# Patient Record
Sex: Female | Born: 1990 | Race: White | Hispanic: No | Marital: Single | Smoking: Never smoker
Health system: Southern US, Community
[De-identification: ages and names within clinical notes are randomized; demographics above are authoritative.]

## PROBLEM LIST (undated history)

## (undated) ENCOUNTER — Inpatient Hospital Stay (HOSPITAL_COMMUNITY): Payer: Self-pay

## (undated) DIAGNOSIS — Z3046 Encounter for surveillance of implantable subdermal contraceptive: Principal | ICD-10-CM

## (undated) DIAGNOSIS — Z903 Acquired absence of stomach [part of]: Secondary | ICD-10-CM

## (undated) DIAGNOSIS — R112 Nausea with vomiting, unspecified: Secondary | ICD-10-CM

## (undated) DIAGNOSIS — R51 Headache: Secondary | ICD-10-CM

## (undated) DIAGNOSIS — R87629 Unspecified abnormal cytological findings in specimens from vagina: Secondary | ICD-10-CM

## (undated) DIAGNOSIS — Z975 Presence of (intrauterine) contraceptive device: Secondary | ICD-10-CM

## (undated) HISTORY — DX: Unspecified abnormal cytological findings in specimens from vagina: R87.629

## (undated) HISTORY — DX: Nausea with vomiting, unspecified: R11.2

## (undated) HISTORY — PX: CYSTECTOMY: SUR359

## (undated) HISTORY — DX: Presence of (intrauterine) contraceptive device: Z97.5

## (undated) HISTORY — DX: Encounter for surveillance of implantable subdermal contraceptive: Z30.46

## (undated) HISTORY — PX: ABDOMINAL SURGERY: SHX537

## (undated) HISTORY — PX: STOMACH SURGERY: SHX791

---
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
## 2009-03-10 ENCOUNTER — Emergency Department (HOSPITAL_COMMUNITY): Admission: EM | Admit: 2009-03-10 | Discharge: 2009-03-10 | Payer: Self-pay | Admitting: Emergency Medicine

## 2010-01-21 ENCOUNTER — Emergency Department (HOSPITAL_COMMUNITY)
Admission: EM | Admit: 2010-01-21 | Discharge: 2010-01-21 | Payer: Self-pay | Source: Home / Self Care | Admitting: Emergency Medicine

## 2010-01-22 ENCOUNTER — Emergency Department (HOSPITAL_COMMUNITY)
Admission: EM | Admit: 2010-01-22 | Discharge: 2010-01-22 | Payer: Self-pay | Source: Home / Self Care | Admitting: Emergency Medicine

## 2010-04-11 LAB — STREP A DNA PROBE

## 2010-04-11 LAB — RAPID STREP SCREEN (MED CTR MEBANE ONLY): Streptococcus, Group A Screen (Direct): NEGATIVE

## 2010-04-20 LAB — CBC
HCT: 43.3 % (ref 36.0–46.0)
WBC: 8.4 10*3/uL (ref 4.0–10.5)

## 2010-04-20 LAB — BASIC METABOLIC PANEL
Chloride: 101 mEq/L (ref 96–112)
Creatinine, Ser: 0.82 mg/dL (ref 0.4–1.2)
GFR calc non Af Amer: >60 mL/min (ref >60)

## 2010-04-20 LAB — DIFFERENTIAL
Basophils Relative: 0 % (ref 0–1)
Monocytes Absolute: 0.3 10*3/uL (ref 0.1–1.0)
Monocytes Relative: 3 % (ref 3–12)
Neutro Abs: 6.3 10*3/uL (ref 1.7–7.7)
Neutrophils Relative %: 75 % (ref 43–77)

## 2011-02-07 ENCOUNTER — Emergency Department (HOSPITAL_COMMUNITY)
Admission: EM | Admit: 2011-02-07 | Discharge: 2011-02-08 | Disposition: A | Discharge status: Home / Self Care | Payer: Self-pay | Attending: Emergency Medicine | Admitting: Emergency Medicine

## 2011-02-07 ENCOUNTER — Encounter: Payer: Self-pay | Admitting: *Deleted

## 2011-02-07 DIAGNOSIS — R3915 Urgency of urination: Secondary | ICD-10-CM | POA: Insufficient documentation

## 2011-02-07 DIAGNOSIS — R300 Dysuria: Secondary | ICD-10-CM | POA: Insufficient documentation

## 2011-02-07 DIAGNOSIS — R350 Frequency of micturition: Secondary | ICD-10-CM | POA: Insufficient documentation

## 2011-02-07 DIAGNOSIS — N390 Urinary tract infection, site not specified: Secondary | ICD-10-CM | POA: Insufficient documentation

## 2011-02-07 DIAGNOSIS — R319 Hematuria, unspecified: Secondary | ICD-10-CM | POA: Insufficient documentation

## 2011-02-07 LAB — URINALYSIS, ROUTINE W REFLEX MICROSCOPIC
Bilirubin Urine: NEGATIVE
Ketones, ur: NEGATIVE mg/dL
Leukocytes, UA: NEGATIVE
Protein, ur: 30 mg/dL — AB
Urobilinogen, UA: 0.2 mg/dL (ref 0.0–1.0)

## 2011-02-07 LAB — URINE MICROSCOPIC-ADD ON

## 2011-02-07 NOTE — ED Notes (Signed)
[place]ia

## 2011-02-08 ENCOUNTER — Encounter (HOSPITAL_COMMUNITY): Payer: Self-pay | Admitting: Emergency Medicine

## 2011-02-08 LAB — URINE CULTURE: Colony Count: >=100000

## 2011-02-08 MED ORDER — CEPHALEXIN 500 MG PO CAPS
500.0000 mg | ORAL_CAPSULE | Freq: Once | ORAL | Status: DC
  Filled 2011-02-08: qty 1

## 2011-02-08 MED ORDER — CEPHALEXIN 500 MG PO CAPS: 500.0000 mg | ORAL_CAPSULE | Freq: Four times a day (QID) | ORAL | Status: AC

## 2011-02-08 NOTE — ED Provider Notes (Signed)
History     CSN: [id number]  Arrival date & time 02/07/11  2032   None     Chief Complaint  Patient presents with  . Hematuria    (Consider location/radiation/quality/duration/timing/severity/associated sxs/prior treatment) Patient is a 21 y.o. female presenting with dysuria. The history is provided by the patient.  Dysuria  This is a new problem. The current episode started more than 2 days ago. The problem occurs intermittently. The problem has not changed since onset.The quality of the pain is described as aching. The pain is mild. There has been no fever. Associated symptoms include frequency, hematuria and urgency. Pertinent negatives include no chills, no sweats, no nausea, no vomiting, no discharge, no hesitancy, no possible pregnancy and no flank pain. She has tried nothing for the symptoms. Her past medical history does not include kidney stones or recurrent UTIs.    History reviewed. No pertinent past medical history.  History reviewed. No pertinent past surgical history.  History reviewed. No pertinent family history.  History  Substance Use Topics  . Smoking status: Never Smoker   . Smokeless tobacco: Not on file  . Alcohol Use: No    OB History    Grav Para Term Preterm Abortions TAB SAB Ect Mult Living                  Review of Systems  Constitutional: Negative for fever and chills.  Gastrointestinal: Negative for nausea, vomiting and abdominal pain.  Genitourinary: Positive for dysuria, urgency, frequency and hematuria. Negative for hesitancy, flank pain, decreased urine volume, vaginal bleeding, vaginal discharge and menstrual problem.  Skin: Negative.   Neurological: Negative for dizziness.  All other systems reviewed and are negative.    Allergies  Review of patient's allergies indicates no known allergies.  Home Medications  No current outpatient prescriptions on file.  BP 139/70  Pulse 65  Temp(Src) 97.6 F (36.4 C) (Oral)  Resp 20  SpO2  100%  LMP 01/17/2011  Physical Exam  Nursing note and vitals reviewed. Constitutional: She is oriented to person, place, and time. She appears well-developed and well-nourished. No distress.  HENT:  Head: Normocephalic and atraumatic.  Mouth/Throat: Oropharynx is clear and moist.  Neck: Neck supple.  Cardiovascular: Normal rate, regular rhythm and normal heart sounds.   Pulmonary/Chest: Effort normal and breath sounds normal. No respiratory distress.  Abdominal: Soft. Normal appearance. She exhibits no distension and no mass. There is no tenderness. There is no rebound, no guarding and no CVA tenderness.  Musculoskeletal: Normal range of motion. She exhibits no tenderness.  Neurological: She is alert and oriented to person, place, and time. No cranial nerve deficit. She exhibits normal muscle tone. Coordination normal.  Skin: Skin is warm and dry.    ED Course  Procedures (including critical care time)  Results for orders placed during the hospital encounter of 02/07/11  URINALYSIS, ROUTINE W REFLEX MICROSCOPIC      Component Value Range   Color, Urine YELLOW  YELLOW    APPearance CLOUDY (*) CLEAR    Specific Gravity, Urine >1.030 (*) 1.005 - 1.030    pH 6.0  5.0 - 8.0    Glucose, UA NEGATIVE  NEGATIVE (mg/dL)   Hgb urine dipstick LARGE (*) NEGATIVE    Bilirubin Urine NEGATIVE  NEGATIVE    Ketones, ur NEGATIVE  NEGATIVE (mg/dL)   Protein, ur 30 (*) NEGATIVE (mg/dL)   Urobilinogen, UA 0.2  0.0 - 1.0 (mg/dL)   Nitrite NEGATIVE  NEGATIVE  Leukocytes, UA NEGATIVE  NEGATIVE   POCT PREGNANCY, URINE      Component Value Range   Preg Test, Ur NEGATIVE    URINE MICROSCOPIC-ADD ON      Component Value Range   Squamous Epithelial / LPF MANY (*) RARE    WBC, UA 21-50  <3 (WBC/hpf)   RBC / HPF 21-50  <3 (RBC/hpf)   Bacteria, UA FEW (*) RARE       Urine C&S pending  MDM    Patient is alert, NAD.  Non-toxic appearing.  Abd is soft, NT.  No fever, vomiting or CVA tenderness to  suggest pyleo.  I will start her on Keflex and culture the urine.  She agrees to return here if the sx's worsen.   Pt stable in ED with no significant deterioration in condition.   Patient / Family / Caregiver understand and agree with initial ED impression and plan with expectations set for ED visit.        Rainee Sweatt L. [place], [place] 02/10/11 1657

## 2011-02-11 NOTE — ED Provider Notes (Signed)
Medical screening examination/treatment/procedure(s) were performed by non-physician practitioner and as supervising physician I was immediately available for consultation/collaboration.   Glynn Octave, MD 02/11/11 [phone]

## 2011-02-11 NOTE — ED Notes (Signed)
+   Urine culture. Treated with Keflex, sensitive to same per protocol MD. 

## 2011-02-28 ENCOUNTER — Encounter (HOSPITAL_COMMUNITY): Payer: Self-pay

## 2011-02-28 ENCOUNTER — Other Ambulatory Visit: Payer: Self-pay

## 2011-02-28 ENCOUNTER — Emergency Department (HOSPITAL_COMMUNITY)
Admission: EM | Admit: 2011-02-28 | Discharge: 2011-02-28 | Disposition: A | Discharge status: Home / Self Care | Payer: Self-pay | Attending: Emergency Medicine | Admitting: Emergency Medicine

## 2011-02-28 DIAGNOSIS — Z136 Encounter for screening for cardiovascular disorders: Secondary | ICD-10-CM | POA: Insufficient documentation

## 2011-02-28 DIAGNOSIS — K529 Noninfective gastroenteritis and colitis, unspecified: Secondary | ICD-10-CM

## 2011-02-28 DIAGNOSIS — R112 Nausea with vomiting, unspecified: Secondary | ICD-10-CM | POA: Insufficient documentation

## 2011-02-28 DIAGNOSIS — R42 Dizziness and giddiness: Secondary | ICD-10-CM | POA: Insufficient documentation

## 2011-02-28 DIAGNOSIS — R10819 Abdominal tenderness, unspecified site: Secondary | ICD-10-CM | POA: Insufficient documentation

## 2011-02-28 LAB — CBC
Hemoglobin: 14.6 g/dL (ref 12.0–15.0)
MCHC: 34.4 g/dL (ref 30.0–36.0)
MCV: 88.5 fL (ref 78.0–100.0)
WBC: 12.7 10*3/uL — ABNORMAL HIGH (ref 4.0–10.5)

## 2011-02-28 LAB — DIFFERENTIAL
Eosinophils Absolute: 0.1 10*3/uL (ref 0.0–0.7)
Eosinophils Relative: 1 % (ref 0–5)
Monocytes Absolute: 0.7 10*3/uL (ref 0.1–1.0)
Monocytes Relative: 5 % (ref 3–12)

## 2011-02-28 LAB — BASIC METABOLIC PANEL
Calcium: 9.6 mg/dL (ref 8.4–10.5)
Chloride: 102 mEq/L (ref 96–112)
Creatinine, Ser: 0.79 mg/dL (ref 0.50–1.10)
Glucose, Bld: 154 mg/dL — ABNORMAL HIGH (ref 70–99)
Potassium: 3.4 mEq/L — ABNORMAL LOW (ref 3.5–5.1)

## 2011-02-28 LAB — GLUCOSE, CAPILLARY: Glucose-Capillary: 113 mg/dL — ABNORMAL HIGH (ref 70–99)

## 2011-02-28 MED ORDER — SODIUM CHLORIDE 0.9 % IV SOLN
Freq: Once | INTRAVENOUS | Status: AC
  Administered 2011-02-28: 13:00:00 via INTRAVENOUS

## 2011-02-28 MED ORDER — PROMETHAZINE HCL 25 MG PO TABS: 25.0000 mg | ORAL_TABLET | Freq: Four times a day (QID) | ORAL | Status: DC | PRN

## 2011-02-28 MED ORDER — ONDANSETRON HCL 4 MG/2ML IJ SOLN
4.0000 mg | Freq: Once | INTRAMUSCULAR | Status: AC
  Administered 2011-02-28: 4 mg via INTRAVENOUS
  Filled 2011-02-28: qty 2

## 2011-02-28 NOTE — ED Provider Notes (Signed)
History    This chart was scribed for Geoffery Lyons, MD, MD by Smitty Pluck. The patient was seen in room APA03 and the patient's care was started at 3:34PM.   CSN: [id number]  Arrival date & time 02/28/11  1252   First MD Initiated Contact with Patient 02/28/11 1510      Chief Complaint  Patient presents with  . Nausea    (Consider location/radiation/quality/duration/timing/severity/associated sxs/prior treatment) The history is provided by the patient.   Nancy Russell is a 21 y.o. female who presents to the Emergency Department complaining of persistent nausea and vomiting onset today. Pt reports this suddenly started. She reports rapid onset of nausea and dizziness. The symptoms were constant without radiation. Pt denies diarrhea and bloody stool. She denies ever having similar occurrence of symptoms. Pt denies sick contact. Pt's mom reports she was pale.   History reviewed. No pertinent past medical history.  History reviewed. No pertinent past surgical history.  No family history on file.  History  Substance Use Topics  . Smoking status: Never Smoker   . Smokeless tobacco: Not on file  . Alcohol Use: No    OB History    Grav Para Term Preterm Abortions TAB SAB Ect Mult Living                  Review of Systems  All other systems reviewed and are negative.   10 Systems reviewed and are negative for acute change except as noted in the HPI.  Allergies  Review of patient's allergies indicates no known allergies.  Home Medications   Current Outpatient Rx  Name Route Sig Dispense Refill  . ACETAMINOPHEN 500 MG PO TABS Oral Take 1,000 mg by mouth every 6 (six) hours as needed. For pain    . IBUPROFEN 200 MG PO TABS Oral Take 400 mg by mouth every 6 (six) hours as needed. For pain      BP 107/44  Pulse 70  Temp(Src) 97.6 F (36.4 C) (Oral)  Resp 20  SpO2 100%  LMP 02/28/2011  Physical Exam  Nursing note and vitals reviewed. Constitutional: She is  oriented to person, place, and time. She appears well-developed and well-nourished.  HENT:  Head: Normocephalic and atraumatic.  Eyes: EOM are normal. Pupils are equal, round, and reactive to light.  Neck: Neck supple. No thyromegaly present.  Cardiovascular: Normal rate, regular rhythm and normal heart sounds.   Pulmonary/Chest: Effort normal and breath sounds normal. No respiratory distress. She has no wheezes. She has no rales.       Lungs are clear bilaterally   Abdominal: Soft. She exhibits no distension. There is tenderness.  Neurological: She is alert and oriented to person, place, and time.  Skin: Skin is warm and dry.  Psychiatric: She has a normal mood and affect. Her behavior is normal.    ED Course  Procedures (including critical care time)  DIAGNOSTIC STUDIES: Oxygen Saturation is 100% on room air, normal by my interpretation.    COORDINATION OF CARE:  3:15PM EDP ordered medication: Zofran and 0.9% NaCl infusion   Labs Reviewed  GLUCOSE, CAPILLARY - Abnormal; Notable for the following:    Glucose-Capillary 113 (*)    All other components within normal limits  CBC - Abnormal; Notable for the following:    WBC 12.7 (*)    All other components within normal limits  DIFFERENTIAL - Abnormal; Notable for the following:    Neutro Abs 7.9 (*)    All other  components within normal limits  BASIC METABOLIC PANEL - Abnormal; Notable for the following:    Potassium 3.4 (*)    Glucose, Bld 154 (*)    All other components within normal limits   No results found.   No diagnosis found.   Date: 02/28/2011  Rate: 57  Rhythm: normal sinus rhythm  QRS Axis: normal  Intervals: normal  ST/T Wave abnormalities: normal  Conduction Disutrbances:none  Narrative Interpretation:   Old EKG Reviewed: unchanged    MDM  History consistent with gastroenteritis.  Will discharge to home with phenergan.  Follow up as needed.      I personally performed the services described  in this documentation, which was scribed in my presence. The recorded information has been reviewed and considered.     Geoffery Lyons, MD 03/01/11 [phone]

## 2011-02-28 NOTE — ED Notes (Signed)
Pt states she was at work and had a sudden onset of n/v and dizziness. Pt hypervent on arrival to ED, pale

## 2011-05-30 ENCOUNTER — Emergency Department (HOSPITAL_COMMUNITY)
Admission: EM | Admit: 2011-05-30 | Discharge: 2011-05-30 | Disposition: A | Discharge status: Home / Self Care | Payer: Self-pay | Attending: Emergency Medicine | Admitting: Emergency Medicine

## 2011-05-30 ENCOUNTER — Encounter (HOSPITAL_COMMUNITY): Payer: Self-pay | Admitting: *Deleted

## 2011-05-30 DIAGNOSIS — R109 Unspecified abdominal pain: Secondary | ICD-10-CM | POA: Insufficient documentation

## 2011-05-30 DIAGNOSIS — Z3201 Encounter for pregnancy test, result positive: Secondary | ICD-10-CM | POA: Insufficient documentation

## 2011-05-30 DIAGNOSIS — Z3490 Encounter for supervision of normal pregnancy, unspecified, unspecified trimester: Secondary | ICD-10-CM

## 2011-05-30 LAB — URINALYSIS, ROUTINE W REFLEX MICROSCOPIC
Bilirubin Urine: NEGATIVE
Glucose, UA: NEGATIVE mg/dL
Hgb urine dipstick: NEGATIVE
Ketones, ur: NEGATIVE mg/dL
Leukocytes, UA: NEGATIVE
Nitrite: NEGATIVE
Protein, ur: NEGATIVE mg/dL
Specific Gravity, Urine: 1.02 (ref 1.005–1.030)
Urobilinogen, UA: 1 mg/dL (ref 0.0–1.0)
pH: 7 (ref 5.0–8.0)

## 2011-05-30 LAB — PREGNANCY, URINE: Preg Test, Ur: POSITIVE — AB

## 2011-05-30 NOTE — ED Notes (Signed)
C/o abdominal pain for a few days

## 2011-05-30 NOTE — ED Notes (Signed)
Thinks she may be pregnant

## 2011-05-30 NOTE — Discharge Instructions (Signed)
 RESOURCE GUIDE  Dental Problems  Patients with Medicaid: [facility]                     [facility] [address]                                           [address] Phone:  [phone]                                                  Phone:  [phone]  If unable to pay or uninsured, contact:  [facility] or [facility] to become qualified for the adult dental clinic.  Chronic Pain Problems Contact [facility]  [phone] Patients need to be referred by their primary care doctor.  Insufficient Money for Medicine Contact [employer]:  call "211" or [facility] 271-5999.  No Primary Care Doctor Call [facility]  [phone] Other agencies that provide inexpensive medical care    [facility]  [phone]    [facility]  [phone]    [facility]  [phone]    [facility] Clinic  [phone]    [facility]  [phone]    [facility]  [phone]  Psychological [facility]  [phone] [facility]  [phone] [facility]   [phone] (emergency services [phone])  Substance Abuse Resources [facility]  336-882-2125 [facility] 336-784-9470 [facility] 336-285-9073 [facility] 336-845-3988 Residential & Outpatient Substance Abuse Program  800-659-3381  Abuse/Neglect [place] Child Abuse Hotline (336) 641-3795 [place] Child Abuse Hotline 800-378-5315 (After Hours)  Emergency Shelter [facility] (336) 271-5985  M[facility] Homes [facility] (336) 275-9566 [facility] (704) 372-4663  MRSA Hotline #:   832-7006    [place] [facility]  [facility] [place]     [employer]                          [facility] [address] [place]                       [address]      [address]  [place]                                                [place]                            [place] Phone:  [phone]                                   Phone:  [phone]                 Phone:  [phone]  [facility] Phone:    [phone]  [place] Child Abuse Hotline (336) 342-1394 (336) 342-3537 (After Hours)   

## 2011-06-03 NOTE — ED Provider Notes (Signed)
History    Differential-year-old female with abdominal discomfort. Gradual onset of couple days ago. Intermittent. Patient is hesitant to call pain. No unusual vaginal bleeding or discharge. Patient's period is late. Has not had home pregnancy test. No fevers or chills. No urinary complaints. No unusual vaginal bleeding or discharge. CSN: [id number]  Arrival date & time 05/30/11  1916   First MD Initiated Contact with Patient 05/30/11 1924      Chief Complaint  Patient presents with  . Abdominal Pain    (Consider location/radiation/quality/duration/timing/severity/associated sxs/prior treatment) HPI  History reviewed. No pertinent past medical history.  History reviewed. No pertinent past surgical history.  No family history on file.  History  Substance Use Topics  . Smoking status: Never Smoker   . Smokeless tobacco: Not on file  . Alcohol Use: No    OB History    Grav Para Term Preterm Abortions TAB SAB Ect Mult Living                  Review of Systems   Review of symptoms negative unless otherwise noted in HPI.   Allergies  Review of patient's allergies indicates no known allergies.  Home Medications   Current Outpatient Rx  Name Route Sig Dispense Refill  . ACETAMINOPHEN 500 MG PO TABS Oral Take 1,000 mg by mouth every 6 (six) hours as needed. For pain    . IBUPROFEN 200 MG PO TABS Oral Take 400 mg by mouth every 6 (six) hours as needed. For pain      BP 140/74  Pulse 100  Temp(Src) 97.5 F (36.4 C) (Oral)  Resp 20  Ht 5\' 4"  (1.626 m)  Wt 249 lb 3 oz (113.031 kg)  BMI 42.77 kg/m2  SpO2 100%  LMP 04/22/2011  Physical Exam  Nursing note and vitals reviewed. Constitutional: She appears well-nourished. No distress.       Laying in bed. Acute distress. Obese the  HENT:  Head: Normocephalic and atraumatic.  Eyes: Conjunctivae are normal. Right eye exhibits no discharge. Left eye exhibits no discharge.  Neck: Neck supple.  Cardiovascular: Normal  rate, regular rhythm and normal heart sounds.  Exam reveals no gallop and no friction rub.   No murmur heard. Pulmonary/Chest: Effort normal and breath sounds normal. No respiratory distress.  Abdominal: Soft. She exhibits no distension and no mass. There is no tenderness.  Musculoskeletal: She exhibits no edema and no tenderness.       No CVA tenderness  Neurological: She is alert.  Skin: Skin is warm and dry. She is not diaphoretic.  Psychiatric: She has a normal mood and affect. Her behavior is normal. Thought content normal.    ED Course  Procedures (including critical care time)  Labs Reviewed  PREGNANCY, URINE - Abnormal; Notable for the following:    Preg Test, Ur POSITIVE (*)    All other components within normal limits  URINALYSIS, ROUTINE W REFLEX MICROSCOPIC  LAB REPORT - SCANNED   No results found.   1. Pregnant       MDM  21 year old female with mild abdominal discomfort. Likely secondary to pregnancy. Patient is well-appearing and has a benign abdominal exam. Very low suspicion for ectopic. Imaging was deferred but very strict return precautions were discussed. UA not suggestive of infection. OB referral provided. Patient instructed to start taking prenatal vitamins.        Raeford Razor, MD 06/03/11 2008

## 2011-06-14 ENCOUNTER — Other Ambulatory Visit: Payer: Self-pay | Admitting: Family Medicine

## 2011-06-14 ENCOUNTER — Other Ambulatory Visit (HOSPITAL_COMMUNITY)
Admission: RE | Admit: 2011-06-14 | Discharge: 2011-06-14 | Disposition: A | Discharge status: Home / Self Care | Payer: Medicaid Other | Source: Ambulatory Visit | Attending: Obstetrics and Gynecology | Admitting: Obstetrics and Gynecology

## 2011-06-14 DIAGNOSIS — Z1159 Encounter for screening for other viral diseases: Secondary | ICD-10-CM | POA: Insufficient documentation

## 2011-06-14 DIAGNOSIS — Z01419 Encounter for gynecological examination (general) (routine) without abnormal findings: Secondary | ICD-10-CM | POA: Insufficient documentation

## 2011-06-14 DIAGNOSIS — Z113 Encounter for screening for infections with a predominantly sexual mode of transmission: Secondary | ICD-10-CM | POA: Insufficient documentation

## 2011-06-25 ENCOUNTER — Encounter (HOSPITAL_COMMUNITY): Payer: Self-pay | Admitting: Obstetrics and Gynecology

## 2011-06-25 ENCOUNTER — Inpatient Hospital Stay (HOSPITAL_COMMUNITY)
Admission: AD | Admit: 2011-06-25 | Discharge: 2011-06-25 | Disposition: A | Discharge status: Home / Self Care | Payer: Medicaid Other | Source: Ambulatory Visit | Attending: Obstetrics & Gynecology | Admitting: Obstetrics & Gynecology

## 2011-06-25 DIAGNOSIS — R109 Unspecified abdominal pain: Secondary | ICD-10-CM | POA: Insufficient documentation

## 2011-06-25 DIAGNOSIS — R197 Diarrhea, unspecified: Secondary | ICD-10-CM | POA: Insufficient documentation

## 2011-06-25 DIAGNOSIS — R1110 Vomiting, unspecified: Secondary | ICD-10-CM

## 2011-06-25 DIAGNOSIS — O210 Mild hyperemesis gravidarum: Secondary | ICD-10-CM | POA: Insufficient documentation

## 2011-06-25 DIAGNOSIS — Z331 Pregnant state, incidental: Secondary | ICD-10-CM

## 2011-06-25 LAB — URINALYSIS, ROUTINE W REFLEX MICROSCOPIC
Bilirubin Urine: NEGATIVE
Hgb urine dipstick: NEGATIVE
Nitrite: NEGATIVE
Specific Gravity, Urine: 1.015 (ref 1.005–1.030)
Urobilinogen, UA: 0.2 mg/dL (ref 0.0–1.0)
pH: 7.5 (ref 5.0–8.0)

## 2011-06-25 MED ORDER — ONDANSETRON 8 MG PO TBDP: 8.0000 mg | ORAL_TABLET | Freq: Three times a day (TID) | ORAL | Status: AC | PRN

## 2011-06-25 MED ORDER — ONDANSETRON 8 MG PO TBDP
8.0000 mg | ORAL_TABLET | Freq: Once | ORAL | Status: AC
  Administered 2011-06-25: 8 mg via ORAL
  Filled 2011-06-25: qty 1

## 2011-06-25 MED ORDER — PROMETHAZINE HCL 25 MG PO TABS: 25.0000 mg | ORAL_TABLET | Freq: Four times a day (QID) | ORAL | Status: DC | PRN

## 2011-06-25 NOTE — MAU Note (Signed)
"  I have been feeling sick to my stomach since last night.  On the way here, I puked up something blue and I haven't had anything blue today.  I have had about 4 episodes of diarrhea in the last 24 hours."

## 2011-06-25 NOTE — Discharge Instructions (Signed)
Liquids today and then advance as tolerated to the [facility]. Call your doctor if your symptoms worsen.

## 2011-06-25 NOTE — MAU Provider Note (Signed)
History     CSN: [id number]  Arrival date and time: 06/25/11 1416   First Provider Initiated Contact with Patient 06/25/11 1508      Chief Complaint  Patient presents with  . Emesis During Pregnancy  . Diarrhea  . Abdominal Pain   HPI Nancy Russell 21 y.o. [redacted]w[redacted]d Has started prenatal care at [facility].  Has had diarrhea and vomiting for 2 days.  Ate cereal this morning and then vomited twice. Has not kept down food or fluids.   Has not had any vomiting previously in this pregnancy.  OB History    Grav Para Term Preterm Abortions TAB SAB Ect Mult Living   1               Past Medical History  Diagnosis Date  . No pertinent past medical history     Past Surgical History  Procedure Date  . Cystectomy at age 89    cyst removed from my chest     Family History  Problem Relation Age of Onset  . Anesthesia problems Neg Hx   . Hypotension Neg Hx   . Malignant hyperthermia Neg Hx   . Pseudochol deficiency Neg Hx     History  Substance Use Topics  . Smoking status: Never Smoker   . Smokeless tobacco: Not on file  . Alcohol Use: No    Allergies: No Known Allergies  Prescriptions prior to admission  Medication Sig Dispense Refill  . Prenatal Vit-Fe Fumarate-FA (PRENATAL MULTIVITAMIN) TABS Take 1 tablet by mouth daily.      Marland Kitchen acetaminophen (TYLENOL) 500 MG tablet Take 1,000 mg by mouth every 6 (six) hours as needed. For pain      . ibuprofen (ADVIL,MOTRIN) 200 MG tablet Take 400 mg by mouth every 6 (six) hours as needed. For pain        Review of Systems  Constitutional: Negative for fever.  Gastrointestinal: Positive for abdominal pain and diarrhea. Negative for nausea, vomiting and constipation.  Genitourinary: Negative for dysuria.       No vaginal bleeding   Physical Exam   Blood pressure 88/69, pulse 73, temperature 97 F (36.1 C), temperature source Oral, resp. rate 20, height 5\' 5"  (1.651 m), weight 247 lb (112.038 kg), last menstrual period  04/22/2011.  Physical Exam  Nursing note and vitals reviewed. Constitutional: She is oriented to person, place, and time. She appears well-developed.       obese  HENT:  Head: Normocephalic.  Eyes: EOM are normal.  Neck: Neck supple.  GI: Soft. Bowel sounds are normal. There is tenderness. There is no rebound and no guarding.       Mildly tender in LLQ and LUQ  Musculoskeletal: Normal range of motion.  Neurological: She is alert and oriented to person, place, and time.  Skin: Skin is warm and dry.  Psychiatric: She has a normal mood and affect.    MAU Course  Procedures  MDM Results for orders placed during the hospital encounter of 06/25/11 (from the past 24 hour(s))  URINALYSIS, ROUTINE W REFLEX MICROSCOPIC     Status: Abnormal   Collection Time   06/25/11  2:30 PM      Component Value Range   Color, Urine YELLOW  YELLOW    APPearance HAZY (*) CLEAR    Specific Gravity, Urine 1.015  1.005 - 1.030    pH 7.5  5.0 - 8.0    Glucose, UA NEGATIVE  NEGATIVE (mg/dL)   Hgb urine  dipstick NEGATIVE  NEGATIVE    Bilirubin Urine NEGATIVE  NEGATIVE    Ketones, ur NEGATIVE  NEGATIVE (mg/dL)   Protein, ur NEGATIVE  NEGATIVE (mg/dL)   Urobilinogen, UA 0.2  0.0 - 1.0 (mg/dL)   Nitrite NEGATIVE  NEGATIVE    Leukocytes, UA NEGATIVE  NEGATIVE    Will give zofran 8 mg ODT in MAU  Assessment and Plan   1. Vomiting and diarrhea   2. Pregnancy as incidental finding      Plan   Clear liquids and advance slowly to brat diet as tolerated.  Medication List  As of 06/25/2011  4:06 PM   START taking these medications         ondansetron 8 MG disintegrating tablet   Commonly known as: ZOFRAN-ODT   Take 1 tablet (8 mg total) by mouth every 8 (eight) hours as needed for nausea.      promethazine 25 MG tablet   Commonly known as: PHENERGAN   Take 1 tablet (25 mg total) by mouth every 6 (six) hours as needed for nausea. May take 1/2 tablet for milder symptoms.  Sedation precautions.          CONTINUE taking these medications         acetaminophen 500 MG tablet   Commonly known as: TYLENOL      prenatal multivitamin Tabs         STOP taking these medications         ibuprofen 200 MG tablet          Where to get your medications    These are the prescriptions that you need to pick up.   You may get these medications from any pharmacy.         ondansetron 8 MG disintegrating tablet   promethazine 25 MG tablet           Follow-up Information    Schedule an appointment as soon as possible for a visit with [facility]. (As needed)    Contact information:   [address] [place] [address]          Khyrie Masi 06/25/2011, 3:53 PM

## 2011-09-03 ENCOUNTER — Encounter (HOSPITAL_COMMUNITY): Payer: Self-pay | Admitting: Family

## 2011-09-03 ENCOUNTER — Inpatient Hospital Stay (HOSPITAL_COMMUNITY)
Admission: AD | Admit: 2011-09-03 | Discharge: 2011-09-03 | Disposition: A | Discharge status: Home / Self Care | Payer: Medicaid Other | Source: Ambulatory Visit | Attending: Obstetrics & Gynecology | Admitting: Obstetrics & Gynecology

## 2011-09-03 ENCOUNTER — Inpatient Hospital Stay (HOSPITAL_COMMUNITY): Payer: Medicaid Other

## 2011-09-03 DIAGNOSIS — O99891 Other specified diseases and conditions complicating pregnancy: Secondary | ICD-10-CM | POA: Insufficient documentation

## 2011-09-03 DIAGNOSIS — S3991XA Unspecified injury of abdomen, initial encounter: Secondary | ICD-10-CM

## 2011-09-03 DIAGNOSIS — IMO0002 Reserved for concepts with insufficient information to code with codable children: Secondary | ICD-10-CM | POA: Insufficient documentation

## 2011-09-03 DIAGNOSIS — R109 Unspecified abdominal pain: Secondary | ICD-10-CM | POA: Insufficient documentation

## 2011-09-03 LAB — URINALYSIS, ROUTINE W REFLEX MICROSCOPIC
Glucose, UA: NEGATIVE mg/dL
Hgb urine dipstick: NEGATIVE
Leukocytes, UA: NEGATIVE
Protein, ur: NEGATIVE mg/dL
Specific Gravity, Urine: 1.02 (ref 1.005–1.030)
Urobilinogen, UA: 0.2 mg/dL (ref 0.0–1.0)
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
## 2011-09-03 NOTE — MAU Provider Note (Signed)
Attestation of Attending Supervision of Advanced Practitioner (CNM/NP): Evaluation and management procedures were performed by the Advanced Practitioner under my supervision and collaboration.  I have reviewed the Advanced Practitioner's note and chart, and I agree with the management and plan.  HARRAWAY-SMITH, Keneshia Tena 4:57 PM     
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
## 2011-11-30 ENCOUNTER — Encounter (HOSPITAL_COMMUNITY): Payer: Self-pay | Admitting: *Deleted

## 2011-11-30 ENCOUNTER — Inpatient Hospital Stay (HOSPITAL_COMMUNITY)
Admission: AD | Admit: 2011-11-30 | Discharge: 2011-11-30 | Disposition: A | Discharge status: Home / Self Care | Payer: Medicaid Other | Source: Ambulatory Visit | Attending: Obstetrics & Gynecology | Admitting: Obstetrics & Gynecology

## 2011-11-30 DIAGNOSIS — O212 Late vomiting of pregnancy: Secondary | ICD-10-CM | POA: Insufficient documentation

## 2011-11-30 DIAGNOSIS — O99891 Other specified diseases and conditions complicating pregnancy: Secondary | ICD-10-CM | POA: Insufficient documentation

## 2011-11-30 DIAGNOSIS — A088 Other specified intestinal infections: Secondary | ICD-10-CM

## 2011-11-30 DIAGNOSIS — A084 Viral intestinal infection, unspecified: Secondary | ICD-10-CM

## 2011-11-30 DIAGNOSIS — O26899 Other specified pregnancy related conditions, unspecified trimester: Secondary | ICD-10-CM

## 2011-11-30 DIAGNOSIS — R51 Headache: Secondary | ICD-10-CM | POA: Insufficient documentation

## 2011-11-30 HISTORY — DX: Headache: R51

## 2011-11-30 LAB — CBC
MCHC: 33.5 g/dL (ref 30.0–36.0)
Platelets: 277 10*3/uL (ref 150–400)
RDW: 12.9 % (ref 11.5–15.5)
WBC: 14.5 10*3/uL — ABNORMAL HIGH (ref 4.0–10.5)

## 2011-11-30 LAB — COMPREHENSIVE METABOLIC PANEL
ALT: 27 U/L (ref 0–35)
AST: 41 U/L — ABNORMAL HIGH (ref 0–37)
Albumin: 3 g/dL — ABNORMAL LOW (ref 3.5–5.2)
Alkaline Phosphatase: 71 U/L (ref 39–117)
BUN: 7 mg/dL (ref 6–23)
Chloride: 97 mEq/L (ref 96–112)
Potassium: 3.7 mEq/L (ref 3.5–5.1)
Sodium: 134 mEq/L — ABNORMAL LOW (ref 135–145)
Total Protein: 6.5 g/dL (ref 6.0–8.3)

## 2011-11-30 LAB — URIC ACID: Uric Acid, Serum: 5.6 mg/dL (ref 2.4–7.0)

## 2011-11-30 LAB — URINALYSIS, ROUTINE W REFLEX MICROSCOPIC
Ketones, ur: >80 mg/dL — AB
Leukocytes, UA: NEGATIVE
Nitrite: NEGATIVE
Protein, ur: 30 mg/dL — AB

## 2011-11-30 LAB — URINE MICROSCOPIC-ADD ON

## 2011-11-30 LAB — PROTEIN / CREATININE RATIO, URINE: Protein Creatinine Ratio: 0.16 — ABNORMAL HIGH (ref 0.00–0.15)

## 2011-11-30 MED ORDER — PROMETHAZINE HCL 25 MG PO TABS: 25.0000 mg | ORAL_TABLET | Freq: Four times a day (QID) | ORAL | Status: AC | PRN

## 2011-11-30 MED ORDER — DEXTROSE 5 % IN LACTATED RINGERS IV BOLUS
1000.0000 mL | Freq: Once | INTRAVENOUS | Status: AC
  Administered 2011-11-30: 1000 mL via INTRAVENOUS

## 2011-11-30 MED ORDER — BUTALBITAL-APAP-CAFFEINE 50-325-40 MG PO TABS
2.0000 | ORAL_TABLET | ORAL | Status: AC
  Administered 2011-11-30: 2 via ORAL
  Filled 2011-11-30: qty 2

## 2011-11-30 MED ORDER — SODIUM CHLORIDE 0.9 % IV SOLN
25.0000 mg | Freq: Once | INTRAVENOUS | Status: AC
  Administered 2011-11-30: 25 mg via INTRAVENOUS
  Filled 2011-11-30: qty 1

## 2011-11-30 MED ORDER — BUTALBITAL-APAP-CAFFEINE 50-325-40 MG PO TABS: 1.0000 | ORAL_TABLET | Freq: Four times a day (QID) | ORAL | Status: DC | PRN

## 2011-11-30 NOTE — MAU Provider Note (Signed)
Attestation of Attending Supervision of Advanced Practitioner (CNM/NP): Evaluation and management procedures were performed by the Advanced Practitioner under my supervision and collaboration.  I have reviewed the Advanced Practitioner's note and chart, and I agree with the management and plan.  HARRAWAY-SMITH, Jovanni Eckhart 8:59 PM

## 2011-11-30 NOTE — MAU Provider Note (Signed)
Attestation of Attending Supervision of Advanced Practitioner (CNM/NP): Evaluation and management procedures were performed by the Advanced Practitioner under my supervision and collaboration.  I have reviewed the Advanced Practitioner's note and chart, and I agree with the management and plan.  HARRAWAY-SMITH, Corrie Brannen 9:25 PM     

## 2011-11-30 NOTE — MAU Note (Signed)
Saw dr yesterday with same complaints, headache and vomiting.  Was given medicine for both, headache and vomiting continues.  Called them back and was instructed to come here.

## 2011-11-30 NOTE — MAU Provider Note (Addendum)
Chief Complaint:  Headache and Emesis   First Provider Initiated Contact with Patient 11/30/11 1748      HPI: Nancy Russell is a 21 y.o. G1P0 at [redacted]w[redacted]d who presents to maternity admissions reporting h/a and n/v x3 days.  She also has swelling of her hands and reports seeing spots in her vision yesterday.  She was seen at [facility] yesterday and prescribed Phenergan and Vicodin.  She has had trouble keeping these medications down.  She reports the Vicodin has not helped her h/a, even when it has stayed down.  She denies epigastric pain or current visual disturbances.  She reports good fetal movement, denies LOF, vaginal bleeding, vaginal itching/burning, urinary symptoms, dizziness, or fever/chills.     Past Medical History: Past Medical History  Diagnosis Date  . Headache      Past Surgical History: Past Surgical History  Procedure Date  . Cystectomy at age 66    cyst removed from my chest     Family History: Family History  Problem Relation Age of Onset  . Anesthesia problems Neg Hx   . Hypotension Neg Hx   . Malignant hyperthermia Neg Hx   . Pseudochol deficiency Neg Hx     Social History: History  Substance Use Topics  . Smoking status: Never Smoker   . Smokeless tobacco: Not on file  . Alcohol Use: No    Allergies: No Known Allergies  Meds:  Prescriptions prior to admission  Medication Sig Dispense Refill  . HYDROcodone-acetaminophen (NORCO/VICODIN) 5-325 MG per tablet Take 1 tablet by mouth every 4 (four) hours as needed. For headache      . Prenatal Vit-Fe Fumarate-FA (PRENATAL MULTIVITAMIN) TABS Take 1 tablet by mouth every evening.       . promethazine (PHENERGAN) 25 MG tablet Take 25 mg by mouth every 6 (six) hours as needed. For nausea and vomiting        ROS: Pertinent findings in history of present illness.  Physical Exam  Blood pressure 119/72, pulse 118, temperature 97.4 F (36.3 C), temperature source Oral, resp. rate 20, height 5\' 4"  (1.626 m),  weight 116.574 kg (257 lb), last menstrual period 04/22/2011. GENERAL: Well-developed, well-nourished female in no acute distress.  HEENT: normocephalic HEART: normal rate RESP: normal effort ABDOMEN: Soft, non-tender, gravid appropriate for gestational age EXTREMITIES: Nontender, no edema NEURO: alert and oriented SPECULUM EXAM: Deferred    FHT:   Baby A: Baseline 145 , moderate variability, accelerations present, no decelerations  Baby B: Baseline 145 , moderate variability, accelerations present, no decelerations Contractions: irregular, Q 2-10 minutes   Labs: Results for orders placed during the hospital encounter of 11/30/11 (from the past 24 hour(s))  URINALYSIS, ROUTINE W REFLEX MICROSCOPIC     Status: Abnormal   Collection Time   11/30/11  4:59 PM      Component Value Range   Color, Urine YELLOW  YELLOW   APPearance HAZY (*) CLEAR   Specific Gravity, Urine >1.030 (*) 1.005 - 1.030   pH 6.0  5.0 - 8.0   Glucose, UA NEGATIVE  NEGATIVE mg/dL   Hgb urine dipstick NEGATIVE  NEGATIVE   Bilirubin Urine SMALL (*) NEGATIVE   Ketones, ur >80 (*) NEGATIVE mg/dL   Protein, ur 30 (*) NEGATIVE mg/dL   Urobilinogen, UA 0.2  0.0 - 1.0 mg/dL   Nitrite NEGATIVE  NEGATIVE   Leukocytes, UA NEGATIVE  NEGATIVE  URINE MICROSCOPIC-ADD ON     Status: Abnormal   Collection Time  11/30/11  4:59 PM      Component Value Range   Squamous Epithelial / LPF MANY (*) RARE   WBC, UA 0-2  <3 WBC/hpf   Bacteria, UA FEW (*) RARE  CBC     Status: Abnormal   Collection Time   11/30/11  5:53 PM      Component Value Range   WBC 14.5 (*) 4.0 - 10.5 K/uL   RBC 4.00  3.87 - 5.11 MIL/uL   Hemoglobin 12.1  12.0 - 15.0 g/dL   HCT 14.7  82.9 - 56.2 %   MCV 90.3  78.0 - 100.0 fL   MCH 30.3  26.0 - 34.0 pg   MCHC 33.5  30.0 - 36.0 g/dL   RDW 13.0  86.5 - 78.4 %   Platelets 277  150 - 400 K/uL  COMPREHENSIVE METABOLIC PANEL     Status: Abnormal   Collection Time   11/30/11  5:53 PM      Component Value  Range   Sodium 134 (*) 135 - 145 mEq/L   Potassium 3.7  3.5 - 5.1 mEq/L   Chloride 97  96 - 112 mEq/L   CO2 22  19 - 32 mEq/L   Glucose, Bld 87  70 - 99 mg/dL   BUN 7  6 - 23 mg/dL   Creatinine, Ser 6.96  0.50 - 1.10 mg/dL   Calcium 9.3  8.4 - 29.5 mg/dL   Total Protein 6.5  6.0 - 8.3 g/dL   Albumin 3.0 (*) 3.5 - 5.2 g/dL   AST 41 (*) 0 - 37 U/L   ALT 27  0 - 35 U/L   Alkaline Phosphatase 71  39 - 117 U/L   Total Bilirubin 0.7  0.3 - 1.2 mg/dL   GFR calc non Af Amer >90  >90 mL/min   GFR calc Af Amer >90  >90 mL/min  URIC ACID     Status: Normal   Collection Time   11/30/11  5:53 PM      Component Value Range   Uric Acid, Serum 5.6  2.4 - 7.0 mg/dL   Protein creatinine ratio pending   Assessment: 1. Viral gastroenteritis   2. Headache in pregnancy   P/C ratio pending  Plan: In MAU:  Phenergan 25 mg in 1000 ml NS   Fioricet 2 tabs x1 dose    D5LR x1075ml  Report to Wynelle Bourgeois, CNM   Sharen Counter Certified Nurse-Midwife 11/30/2011 5:54 PM  Feels much better and wants to go home  Will Rx Phenergan and PRN Fioricet.  Follow up with [facility]

## 2011-12-22 ENCOUNTER — Inpatient Hospital Stay (HOSPITAL_COMMUNITY)
Admission: AD | Admit: 2011-12-22 | Discharge: 2011-12-22 | Disposition: A | Discharge status: Home / Self Care | Payer: Medicaid Other | Source: Ambulatory Visit | Attending: Obstetrics & Gynecology | Admitting: Obstetrics & Gynecology

## 2011-12-22 ENCOUNTER — Encounter (HOSPITAL_COMMUNITY): Payer: Self-pay | Admitting: *Deleted

## 2011-12-22 DIAGNOSIS — O139 Gestational [pregnancy-induced] hypertension without significant proteinuria, unspecified trimester: Secondary | ICD-10-CM

## 2011-12-22 DIAGNOSIS — O99891 Other specified diseases and conditions complicating pregnancy: Secondary | ICD-10-CM | POA: Insufficient documentation

## 2011-12-22 DIAGNOSIS — O30009 Twin pregnancy, unspecified number of placenta and unspecified number of amniotic sacs, unspecified trimester: Secondary | ICD-10-CM | POA: Insufficient documentation

## 2011-12-22 DIAGNOSIS — R079 Chest pain, unspecified: Secondary | ICD-10-CM | POA: Insufficient documentation

## 2011-12-22 DIAGNOSIS — IMO0002 Reserved for concepts with insufficient information to code with codable children: Secondary | ICD-10-CM | POA: Insufficient documentation

## 2011-12-22 DIAGNOSIS — O169 Unspecified maternal hypertension, unspecified trimester: Secondary | ICD-10-CM

## 2011-12-22 DIAGNOSIS — R1011 Right upper quadrant pain: Secondary | ICD-10-CM | POA: Insufficient documentation

## 2011-12-22 LAB — URINALYSIS, ROUTINE W REFLEX MICROSCOPIC
Glucose, UA: NEGATIVE mg/dL
Ketones, ur: NEGATIVE mg/dL
Leukocytes, UA: NEGATIVE
Protein, ur: 30 mg/dL — AB
Urobilinogen, UA: 0.2 mg/dL (ref 0.0–1.0)

## 2011-12-22 LAB — COMPREHENSIVE METABOLIC PANEL
ALT: 20 U/L (ref 0–35)
AST: 30 U/L (ref 0–37)
CO2: 25 mEq/L (ref 19–32)
Chloride: 99 mEq/L (ref 96–112)
Creatinine, Ser: 0.77 mg/dL (ref 0.50–1.10)
GFR calc non Af Amer: >90 mL/min (ref >90)
Sodium: 133 mEq/L — ABNORMAL LOW (ref 135–145)
Total Bilirubin: 0.4 mg/dL (ref 0.3–1.2)

## 2011-12-22 LAB — CBC
MCH: 30.6 pg (ref 26.0–34.0)
MCHC: 33.6 g/dL (ref 30.0–36.0)
Platelets: 271 10*3/uL (ref 150–400)

## 2011-12-22 LAB — URINE MICROSCOPIC-ADD ON

## 2011-12-22 LAB — PROTEIN / CREATININE RATIO, URINE: Creatinine, Urine: 235.17 mg/dL

## 2011-12-22 NOTE — MAU Note (Signed)
Patient states she had had increasing swelling in her hands and feet. States her mom took her blood pressure and it was 171/100. Has been having upper chest pain that radiates to under the left breast. Denies any bleeding or leaking and has felt less movement of her twins today than usual.

## 2011-12-22 NOTE — MAU Provider Note (Signed)
History     CSN: [id number]  Arrival date and time: 12/22/11 1153   First Provider Initiated Contact with Patient 12/22/11 1352      Chief Complaint  Patient presents with  . Leg Swelling  . Chest Pain   HPI: Pt is a 21 y.o. G1P0000 at [redacted]w[redacted]d by LMP consistent with 6w ultrasound, presenting with increased swelling in hands and feet with measured BP 171/100 prior to presentation (pt's mother works at a nursing facility, Charity fundraiser checked pt's pressure there). Also complains of some RUQ pain and "chest tightness" that does not radiate to neck/shoulders/back and is not worse with breathing. RUQ pain and chest tightness are new this morning. Pt endorses intermittent N/V throughout pregnancy; denies current N/V but "when I have it, it's really bad." Denies bleeding, LOF. Endorses fetal movements. Denies hx of blood clots or complications with this pregnancy. Does state that some BP's at "recent prenatal visits" have been "up," but she does not know specific readings.  OB History    Grav Para Term Preterm Abortions TAB SAB Ect Mult Living   1 0 0 0 0 0 0 0 0 0       Past Medical History  Diagnosis Date  . Headache     Past Surgical History  Procedure Date  . Cystectomy at age 30    cyst removed from my chest     Family History  Problem Relation Age of Onset  . Anesthesia problems Neg Hx   . Hypotension Neg Hx   . Malignant hyperthermia Neg Hx   . Pseudochol deficiency Neg Hx   . Other Neg Hx     History  Substance Use Topics  . Smoking status: Never Smoker   . Smokeless tobacco: Not on file  . Alcohol Use: No    Allergies: No Known Allergies  Prescriptions prior to admission  Medication Sig Dispense Refill  . butalbital-acetaminophen-caffeine (FIORICET) 50-325-40 MG per tablet Take 1-2 tablets by mouth every 6 (six) hours as needed for headache.  20 tablet  0  . HYDROcodone-acetaminophen (NORCO/VICODIN) 5-325 MG per tablet Take 1 tablet by mouth every 4 (four) hours as  needed. For headache      . Prenatal Vit-Fe Fumarate-FA (PRENATAL MULTIVITAMIN) TABS Take 1 tablet by mouth every evening.       . promethazine (PHENERGAN) 25 MG tablet Take 25 mg by mouth every 6 (six) hours as needed. For nausea and vomiting        ROS: See HPI  Physical Exam   Blood pressure 132/85, pulse 108, temperature 97.7 F (36.5 C), temperature source Oral, resp. rate 20, height 5' 4.5" (1.638 m), weight 122.29 kg (269 lb 9.6 oz), last menstrual period 04/22/2011, SpO2 99.00%.  Physical Exam  Nursing note and vitals reviewed. Constitutional: She is oriented to person, place, and time. She appears well-developed and well-nourished.  HENT:  Head: Normocephalic and atraumatic.  Eyes: Conjunctivae normal are normal. Pupils are equal, round, and reactive to light.  Neck: Normal range of motion. Neck supple.  Cardiovascular: Normal rate and regular rhythm.   No murmur heard. Respiratory: Effort normal and breath sounds normal. She has no wheezes.  GI: Soft. There is no tenderness. There is no rebound.  Neurological: She is alert and oriented to person, place, and time.  Skin: Skin is warm and dry.  Psychiatric: She has a normal mood and affect.    MAU Course  Procedures: None  MDM: Reported elevated BP prior to presentation to MAU. Pressures  here in the 120's-130's/70's-90's; few 140's systolic. CBC and CMP reassuring. Urine prot/Cr ratio 0.22. Both twins with reactive FHR tracings.   Assessment and Plan  Twin IUP at [redacted]w[redacted]d by LMP -possible mild pre-E  -no new medical therapy  -recommend 24h urine collection to take to [facility] for f/u Monday 11/25 -will instruct pt to talk to primary OB about possible growth sonogram -discuss red flags for representation to MAU (new/worse headaches or RUQ pain, changes in vision, worse swelling, etc)  Street, [place] 12/22/2011, 3:30 PM

## 2011-12-23 NOTE — MAU Provider Note (Signed)
I saw and examined patient and reviewed NSTs (reactive for both twins). Discussed with Dr. Macon Large. Home with 24 hour urine collection and f/u with [facility] Monday for BP check. Pt understands signs/symptoms to return to hospital. Napoleon Form, MD

## 2012-01-04 ENCOUNTER — Encounter (HOSPITAL_COMMUNITY)
Admission: RE | Admit: 2012-01-04 | Discharge: 2012-01-04 | Disposition: A | Discharge status: Home / Self Care | Payer: Medicaid Other | Source: Ambulatory Visit | Attending: Obstetrics & Gynecology | Admitting: Obstetrics & Gynecology

## 2012-01-04 ENCOUNTER — Other Ambulatory Visit (HOSPITAL_COMMUNITY): Payer: Self-pay | Admitting: Obstetrics & Gynecology

## 2012-01-04 ENCOUNTER — Encounter (HOSPITAL_COMMUNITY): Payer: Self-pay | Admitting: *Deleted

## 2012-01-04 LAB — CBC
Hemoglobin: 11.7 g/dL — ABNORMAL LOW (ref 12.0–15.0)
RBC: 3.89 MIL/uL (ref 3.87–5.11)

## 2012-01-04 LAB — RPR: RPR Ser Ql: NONREACTIVE

## 2012-01-04 MED ORDER — LACTATED RINGERS IV SOLN: INTRAVENOUS | Status: DC

## 2012-01-05 ENCOUNTER — Encounter (HOSPITAL_COMMUNITY)
Admission: RE | Disposition: A | Discharge status: Home / Self Care | Payer: Self-pay | Source: Ambulatory Visit | Attending: Obstetrics & Gynecology

## 2012-01-05 ENCOUNTER — Encounter (HOSPITAL_COMMUNITY): Payer: Self-pay

## 2012-01-05 ENCOUNTER — Inpatient Hospital Stay (HOSPITAL_COMMUNITY)
Admission: RE | Admit: 2012-01-05 | Discharge: 2012-01-08 | DRG: 765 | Disposition: A | Discharge status: Home / Self Care | Payer: Medicaid Other | Source: Ambulatory Visit | Attending: Obstetrics & Gynecology | Admitting: Obstetrics & Gynecology

## 2012-01-05 ENCOUNTER — Encounter (HOSPITAL_COMMUNITY): Payer: Self-pay | Admitting: *Deleted

## 2012-01-05 ENCOUNTER — Inpatient Hospital Stay (HOSPITAL_COMMUNITY): Payer: Medicaid Other

## 2012-01-05 DIAGNOSIS — O30009 Twin pregnancy, unspecified number of placenta and unspecified number of amniotic sacs, unspecified trimester: Secondary | ICD-10-CM

## 2012-01-05 DIAGNOSIS — O365990 Maternal care for other known or suspected poor fetal growth, unspecified trimester, not applicable or unspecified: Secondary | ICD-10-CM | POA: Diagnosis present

## 2012-01-05 DIAGNOSIS — IMO0002 Reserved for concepts with insufficient information to code with codable children: Principal | ICD-10-CM | POA: Diagnosis present

## 2012-01-05 DIAGNOSIS — O329XX0 Maternal care for malpresentation of fetus, unspecified, not applicable or unspecified: Secondary | ICD-10-CM

## 2012-01-05 DIAGNOSIS — O30039 Twin pregnancy, monochorionic/diamniotic, unspecified trimester: Secondary | ICD-10-CM

## 2012-01-05 DIAGNOSIS — O1490 Unspecified pre-eclampsia, unspecified trimester: Secondary | ICD-10-CM

## 2012-01-05 DIAGNOSIS — O3090 Multiple gestation, unspecified, unspecified trimester: Secondary | ICD-10-CM

## 2012-01-05 LAB — URINALYSIS, ROUTINE W REFLEX MICROSCOPIC
Hgb urine dipstick: NEGATIVE
Nitrite: NEGATIVE
Specific Gravity, Urine: >1.03 — ABNORMAL HIGH (ref 1.005–1.030)
Urobilinogen, UA: 0.2 mg/dL (ref 0.0–1.0)
pH: 6 (ref 5.0–8.0)

## 2012-01-05 LAB — SURGICAL PCR SCREEN
MRSA, PCR: NEGATIVE
Staphylococcus aureus: NEGATIVE

## 2012-01-05 LAB — COMPREHENSIVE METABOLIC PANEL
AST: 32 U/L (ref 0–37)
CO2: 21 mEq/L (ref 19–32)
Calcium: 9.3 mg/dL (ref 8.4–10.5)
Creatinine, Ser: 0.63 mg/dL (ref 0.50–1.10)
GFR calc non Af Amer: >90 mL/min (ref >90)
Total Protein: 6.2 g/dL (ref 6.0–8.3)

## 2012-01-05 LAB — PREPARE RBC (CROSSMATCH)

## 2012-01-05 LAB — URINE MICROSCOPIC-ADD ON

## 2012-01-05 SURGERY — Surgical Case
Anesthesia: Spinal | Site: Abdomen | Wound class: Clean Contaminated

## 2012-01-05 MED ORDER — OXYTOCIN 40 UNITS IN LACTATED RINGERS INFUSION - SIMPLE MED: 62.5000 mL/h | INTRAVENOUS | Status: DC

## 2012-01-05 MED ORDER — NALBUPHINE HCL 10 MG/ML IJ SOLN
5.0000 mg | INTRAMUSCULAR | Status: DC | PRN
  Filled 2012-01-05: qty 1

## 2012-01-05 MED ORDER — KETOROLAC TROMETHAMINE 30 MG/ML IJ SOLN: 30.0000 mg | Freq: Four times a day (QID) | INTRAMUSCULAR | Status: AC | PRN

## 2012-01-05 MED ORDER — DIBUCAINE 1 % RE OINT: 1.0000 "application " | TOPICAL_OINTMENT | RECTAL | Status: DC | PRN

## 2012-01-05 MED ORDER — SODIUM CHLORIDE 0.9 % IJ SOLN: 3.0000 mL | INTRAMUSCULAR | Status: DC | PRN

## 2012-01-05 MED ORDER — IBUPROFEN 600 MG PO TABS
600.0000 mg | ORAL_TABLET | Freq: Four times a day (QID) | ORAL | Status: DC | PRN
  Filled 2012-01-05 (×7): qty 1

## 2012-01-05 MED ORDER — ONDANSETRON HCL 4 MG PO TABS: 4.0000 mg | ORAL_TABLET | ORAL | Status: DC | PRN

## 2012-01-05 MED ORDER — OXYCODONE-ACETAMINOPHEN 5-325 MG PO TABS
1.0000 | ORAL_TABLET | ORAL | Status: DC | PRN
  Administered 2012-01-06 – 2012-01-08 (×5): 1 via ORAL
  Filled 2012-01-05 (×5): qty 1

## 2012-01-05 MED ORDER — DIPHENHYDRAMINE HCL 50 MG/ML IJ SOLN: 25.0000 mg | INTRAMUSCULAR | Status: DC | PRN

## 2012-01-05 MED ORDER — EPHEDRINE 5 MG/ML INJ
INTRAVENOUS | Status: AC
Start: 2012-01-05 — End: 2012-01-05
  Filled 2012-01-05: qty 10

## 2012-01-05 MED ORDER — MAGNESIUM SULFATE BOLUS VIA INFUSION
4.0000 g | Freq: Once | INTRAVENOUS | Status: AC
Start: 2012-01-05 — End: 2012-01-05
  Administered 2012-01-05: 4 g via INTRAVENOUS
  Filled 2012-01-05: qty 500

## 2012-01-05 MED ORDER — SODIUM BICARBONATE 8.4 % IV SOLN
INTRAVENOUS | Status: DC | PRN
  Administered 2012-01-05: 20 mL via EPIDURAL

## 2012-01-05 MED ORDER — KETOROLAC TROMETHAMINE 60 MG/2ML IM SOLN
INTRAMUSCULAR | Status: AC
  Administered 2012-01-05: 60 mg via INTRAMUSCULAR
  Filled 2012-01-05: qty 2

## 2012-01-05 MED ORDER — DEXTROSE 5 % IV SOLN
1.0000 ug/kg/h | INTRAVENOUS | Status: DC | PRN
  Filled 2012-01-05: qty 2

## 2012-01-05 MED ORDER — MENTHOL 3 MG MT LOZG: 1.0000 | LOZENGE | OROMUCOSAL | Status: DC | PRN

## 2012-01-05 MED ORDER — SIMETHICONE 80 MG PO CHEW
80.0000 mg | CHEWABLE_TABLET | Freq: Three times a day (TID) | ORAL | Status: DC
  Administered 2012-01-05 – 2012-01-08 (×10): 80 mg via ORAL

## 2012-01-05 MED ORDER — ZOLPIDEM TARTRATE 5 MG PO TABS: 5.0000 mg | ORAL_TABLET | Freq: Every evening | ORAL | Status: DC | PRN

## 2012-01-05 MED ORDER — DIPHENHYDRAMINE HCL 25 MG PO CAPS
25.0000 mg | ORAL_CAPSULE | ORAL | Status: DC | PRN
  Filled 2012-01-05: qty 1

## 2012-01-05 MED ORDER — ONDANSETRON HCL 4 MG/2ML IJ SOLN: 4.0000 mg | INTRAMUSCULAR | Status: DC | PRN

## 2012-01-05 MED ORDER — HYDROMORPHONE HCL PF 1 MG/ML IJ SOLN: 0.2500 mg | INTRAMUSCULAR | Status: DC | PRN

## 2012-01-05 MED ORDER — FENTANYL CITRATE 0.05 MG/ML IJ SOLN
INTRAMUSCULAR | Status: DC | PRN
  Administered 2012-01-05: 15 ug via INTRATHECAL
  Administered 2012-01-05: 50 ug via INTRAVENOUS
  Administered 2012-01-05: 35 ug via INTRAVENOUS

## 2012-01-05 MED ORDER — MEPERIDINE HCL 25 MG/ML IJ SOLN: 6.2500 mg | INTRAMUSCULAR | Status: DC | PRN

## 2012-01-05 MED ORDER — SCOPOLAMINE 1 MG/3DAYS TD PT72
MEDICATED_PATCH | TRANSDERMAL | Status: AC
  Administered 2012-01-05: 1.5 mg via TRANSDERMAL
  Filled 2012-01-05: qty 1

## 2012-01-05 MED ORDER — LACTATED RINGERS IV SOLN
INTRAVENOUS | Status: DC
  Administered 2012-01-05 – 2012-01-06 (×2): via INTRAVENOUS

## 2012-01-05 MED ORDER — METHYLERGONOVINE MALEATE 0.2 MG PO TABS
0.2000 mg | ORAL_TABLET | ORAL | Status: DC | PRN
Start: 2012-01-05 — End: 2012-01-08

## 2012-01-05 MED ORDER — EPHEDRINE SULFATE 50 MG/ML IJ SOLN
INTRAMUSCULAR | Status: DC | PRN
  Administered 2012-01-05: 5 mg via INTRAVENOUS

## 2012-01-05 MED ORDER — METHYLERGONOVINE MALEATE 0.2 MG/ML IJ SOLN: 0.2000 mg | INTRAMUSCULAR | Status: DC | PRN

## 2012-01-05 MED ORDER — NALOXONE HCL 0.4 MG/ML IJ SOLN: 0.4000 mg | INTRAMUSCULAR | Status: DC | PRN

## 2012-01-05 MED ORDER — SCOPOLAMINE 1 MG/3DAYS TD PT72: 1.0000 | MEDICATED_PATCH | Freq: Once | TRANSDERMAL | Status: DC

## 2012-01-05 MED ORDER — MORPHINE SULFATE (PF) 0.5 MG/ML IJ SOLN
INTRAMUSCULAR | Status: DC | PRN
  Administered 2012-01-05: .1 mg via INTRATHECAL
  Administered 2012-01-05: .5 mg via INTRAVENOUS
  Administered 2012-01-05: .5 mg via EPIDURAL

## 2012-01-05 MED ORDER — PRENATAL MULTIVITAMIN CH: 1.0000 | ORAL_TABLET | Freq: Every day | ORAL | Status: DC

## 2012-01-05 MED ORDER — LACTATED RINGERS IV SOLN
INTRAVENOUS | Status: DC
  Administered 2012-01-05 (×4): via INTRAVENOUS

## 2012-01-05 MED ORDER — SCOPOLAMINE 1 MG/3DAYS TD PT72
1.0000 | MEDICATED_PATCH | TRANSDERMAL | Status: DC
  Administered 2012-01-05: 1.5 mg via TRANSDERMAL

## 2012-01-05 MED ORDER — CHLOROPROCAINE HCL 3 % IJ SOLN
INTRAMUSCULAR | Status: AC
  Filled 2012-01-05: qty 20

## 2012-01-05 MED ORDER — OXYTOCIN 10 UNIT/ML IJ SOLN
INTRAMUSCULAR | Status: AC
  Filled 2012-01-05: qty 4

## 2012-01-05 MED ORDER — OXYTOCIN 10 UNIT/ML IJ SOLN
40.0000 [IU] | INTRAMUSCULAR | Status: DC | PRN
  Administered 2012-01-05: 40 [IU] via INTRAVENOUS

## 2012-01-05 MED ORDER — DIPHENHYDRAMINE HCL 50 MG/ML IJ SOLN: 12.5000 mg | INTRAMUSCULAR | Status: DC | PRN

## 2012-01-05 MED ORDER — IBUPROFEN 600 MG PO TABS
600.0000 mg | ORAL_TABLET | Freq: Four times a day (QID) | ORAL | Status: DC
  Administered 2012-01-05 – 2012-01-08 (×11): 600 mg via ORAL
  Filled 2012-01-05 (×4): qty 1

## 2012-01-05 MED ORDER — METOCLOPRAMIDE HCL 5 MG/ML IJ SOLN: 10.0000 mg | Freq: Three times a day (TID) | INTRAMUSCULAR | Status: DC | PRN

## 2012-01-05 MED ORDER — ONDANSETRON HCL 4 MG/2ML IJ SOLN
INTRAMUSCULAR | Status: AC
  Filled 2012-01-05: qty 2

## 2012-01-05 MED ORDER — MAGNESIUM SULFATE 40 G IN LACTATED RINGERS - SIMPLE
2.0000 g/h | INTRAVENOUS | Status: DC
  Filled 2012-01-05: qty 500

## 2012-01-05 MED ORDER — BUPIVACAINE IN DEXTROSE 0.75-8.25 % IT SOLN
INTRATHECAL | Status: DC | PRN
  Administered 2012-01-05: 1.4 mL via INTRATHECAL

## 2012-01-05 MED ORDER — LANOLIN HYDROUS EX OINT: 1.0000 "application " | TOPICAL_OINTMENT | CUTANEOUS | Status: DC | PRN

## 2012-01-05 MED ORDER — MAGNESIUM SULFATE 40 G IN LACTATED RINGERS - SIMPLE
2.0000 g/h | INTRAVENOUS | Status: AC
  Filled 2012-01-05: qty 500

## 2012-01-05 MED ORDER — WITCH HAZEL-GLYCERIN EX PADS: 1.0000 "application " | MEDICATED_PAD | CUTANEOUS | Status: DC | PRN

## 2012-01-05 MED ORDER — SENNOSIDES-DOCUSATE SODIUM 8.6-50 MG PO TABS
2.0000 | ORAL_TABLET | Freq: Every day | ORAL | Status: DC
  Administered 2012-01-05 – 2012-01-07 (×3): 2 via ORAL

## 2012-01-05 MED ORDER — SIMETHICONE 80 MG PO CHEW: 80.0000 mg | CHEWABLE_TABLET | ORAL | Status: DC | PRN

## 2012-01-05 MED ORDER — DIPHENHYDRAMINE HCL 25 MG PO CAPS: 25.0000 mg | ORAL_CAPSULE | Freq: Four times a day (QID) | ORAL | Status: DC | PRN

## 2012-01-05 MED ORDER — PRENATAL MULTIVITAMIN CH
1.0000 | ORAL_TABLET | Freq: Every day | ORAL | Status: DC
  Administered 2012-01-05 – 2012-01-08 (×2): 1 via ORAL
  Filled 2012-01-05 (×2): qty 1

## 2012-01-05 MED ORDER — KETOROLAC TROMETHAMINE 60 MG/2ML IM SOLN
60.0000 mg | Freq: Once | INTRAMUSCULAR | Status: AC | PRN
  Administered 2012-01-05: 60 mg via INTRAMUSCULAR

## 2012-01-05 MED ORDER — DEXTROSE 5 % IV SOLN
3.0000 g | INTRAVENOUS | Status: AC
  Administered 2012-01-05: 3 g via INTRAVENOUS
  Filled 2012-01-05: qty 3000

## 2012-01-05 MED ORDER — FENTANYL CITRATE 0.05 MG/ML IJ SOLN
INTRAMUSCULAR | Status: AC
  Filled 2012-01-05: qty 2

## 2012-01-05 MED ORDER — TETANUS-DIPHTH-ACELL PERTUSSIS 5-2.5-18.5 LF-MCG/0.5 IM SUSP
0.5000 mL | Freq: Once | INTRAMUSCULAR | Status: AC
  Administered 2012-01-06: 0.5 mL via INTRAMUSCULAR
  Filled 2012-01-05: qty 0.5

## 2012-01-05 MED ORDER — ONDANSETRON HCL 4 MG/2ML IJ SOLN
INTRAMUSCULAR | Status: DC | PRN
  Administered 2012-01-05: 4 mg via INTRAVENOUS

## 2012-01-05 MED ORDER — MORPHINE SULFATE 0.5 MG/ML IJ SOLN
INTRAMUSCULAR | Status: AC
  Filled 2012-01-05: qty 10

## 2012-01-05 MED ORDER — ONDANSETRON HCL 4 MG/2ML IJ SOLN: 4.0000 mg | Freq: Three times a day (TID) | INTRAMUSCULAR | Status: DC | PRN

## 2012-01-05 SURGICAL SUPPLY — 31 items
CLOTH BEACON ORANGE TIMEOUT ST (SAFETY) ×2 IMPLANT
DERMABOND ADVANCED (GAUZE/BANDAGES/DRESSINGS) ×1
DERMABOND ADVANCED .7 DNX12 (GAUZE/BANDAGES/DRESSINGS) ×1 IMPLANT
DRSG OPSITE POSTOP 4X10 (GAUZE/BANDAGES/DRESSINGS) IMPLANT
DURAPREP 26ML APPLICATOR (WOUND CARE) ×6 IMPLANT
ELECT REM PT RETURN 9FT ADLT (ELECTROSURGICAL) ×2
ELECTRODE REM PT RTRN 9FT ADLT (ELECTROSURGICAL) ×1 IMPLANT
EXTRACTOR VACUUM BELL STYLE (SUCTIONS) IMPLANT
GLOVE BIOGEL PI IND STRL 8 (GLOVE) ×2 IMPLANT
GLOVE BIOGEL PI INDICATOR 8 (GLOVE) ×2
GLOVE ECLIPSE 8.0 STRL XLNG CF (GLOVE) ×2 IMPLANT
KIT ABG SYR 3ML LUER SLIP (SYRINGE) ×2 IMPLANT
NEEDLE HYPO 25X5/8 SAFETYGLIDE (NEEDLE) ×2 IMPLANT
NS IRRIG 1000ML POUR BTL (IV SOLUTION) ×2 IMPLANT
PACK C SECTION WH (CUSTOM PROCEDURE TRAY) ×2 IMPLANT
PAD OB MATERNITY 4.3X12.25 (PERSONAL CARE ITEMS) IMPLANT
RTRCTR C-SECT PINK 25CM LRG (MISCELLANEOUS) IMPLANT
SLEEVE SCD COMPRESS KNEE MED (MISCELLANEOUS) IMPLANT
STAPLER VISISTAT 35W (STAPLE) IMPLANT
SUT CHROMIC 0 CT 1 (SUTURE) ×2 IMPLANT
SUT MNCRL 0 VIOLET CTX 36 (SUTURE) ×2 IMPLANT
SUT MONOCRYL 0 CTX 36 (SUTURE) ×2
SUT PLAIN 2 0 (SUTURE)
SUT PLAIN 2 0 XLH (SUTURE) IMPLANT
SUT PLAIN ABS 2-0 CT1 27XMFL (SUTURE) IMPLANT
SUT VIC AB 0 CTX 36 (SUTURE) ×2
SUT VIC AB 0 CTX36XBRD ANBCTRL (SUTURE) ×1 IMPLANT
SUT VIC AB 4-0 KS 27 (SUTURE) IMPLANT
TOWEL OR 17X24 6PK STRL BLUE (TOWEL DISPOSABLE) ×4 IMPLANT
TRAY FOLEY CATH 14FR (SET/KITS/TRAYS/PACK) IMPLANT
WATER STERILE IRR 1000ML POUR (IV SOLUTION) ×2 IMPLANT

## 2012-01-05 NOTE — Transfer of Care (Signed)
Immediate Anesthesia Transfer of Care Note  Patient: Nancy Russell  Procedure(s) Performed: Procedure(s) (LRB) with comments: CESAREAN SECTION (N/A)  Patient Location: PACU  Anesthesia Type:Spinal and Epidural  Level of Consciousness: awake, alert  and oriented  Airway & Oxygen Therapy: Patient Spontanous Breathing  Post-op Assessment: Report given to PACU RN and Post -op Vital signs reviewed and stable  Post vital signs: Reviewed and stable  Complications: No apparent anesthesia complications

## 2012-01-05 NOTE — H&P (Signed)
Nancy Russell is an 21 y.o. female. G 1 P 0 with EDD 01/27/2012 now at 36 weeks and 6 days with  Monochorionic/diamniotic twins.  She developed protein evidence of pre eclampsia last week with 24 hour protein of 334 mg, with BP being normal, as were the remainder of her labs.  Yesterday her protein was 3+ and a protein/creatinine ratio returned subsequently as 1.31.  Additionally while antepartum testing has always been reassuring her growth of the twins has now dipped below 3% as of 01/01/2012 and Twin B is >20% smaller than Twin A.  The doppler flow, fluid, BPP for both twins have always been normal and reassuring.  As a result with this constellation of clinical features I am proceeding with a primary Caesarean section with Twin A being breech.  Pertinent Gynecological History: Menstrual History: Menarche age: 69 Patient's last menstrual period was 04/22/2011.    Past Medical History  Diagnosis Date  . Headache     Past Surgical History  Procedure Date  . Cystectomy at age 16    cyst removed from my chest     Family History  Problem Relation Age of Onset  . Anesthesia problems Neg Hx   . Hypotension Neg Hx   . Malignant hyperthermia Neg Hx   . Pseudochol deficiency Neg Hx   . Other Neg Hx     Social History:  reports that she has never smoked. She has never used smokeless tobacco. She reports that she does not drink alcohol or use illicit drugs.  Allergies: No Known Allergies  Prescriptions prior to admission  Medication Sig Dispense Refill  . Prenatal Vit-Fe Fumarate-FA (PRENATAL MULTIVITAMIN) TABS Take 1 tablet by mouth every evening.         ROS  Review of Systems  Constitutional: Negative for fever, chills, weight loss, malaise/fatigue and diaphoresis.  HENT: Negative for hearing loss, ear pain, nosebleeds, congestion, sore throat, neck pain, tinnitus and ear discharge.   Eyes: Negative for blurred vision, double vision, photophobia, pain, discharge and redness.   Respiratory: Negative for cough, hemoptysis, sputum production, shortness of breath, wheezing and stridor.   Cardiovascular: Negative for chest pain, palpitations, orthopnea, claudication, leg swelling and PND.  Gastrointestinal: Positive for abdominal pain. Negative for heartburn, nausea, vomiting, diarrhea, constipation, blood in stool and melena.  Genitourinary: Negative for dysuria, urgency, frequency, hematuria and flank pain.  Musculoskeletal: Negative for myalgias, back pain, joint pain and falls.  Skin: Negative for itching and rash.  Neurological: Negative for dizziness, tingling, tremors, sensory change, speech change, focal weakness, seizures, loss of consciousness, weakness and headaches.  Endo/Heme/Allergies: Negative for environmental allergies and polydipsia. Does not bruise/bleed easily.  Psychiatric/Behavioral: Negative for depression, suicidal ideas, hallucinations, memory loss and substance abuse. The patient is not nervous/anxious and does not have insomnia.     Blood pressure 150/95, pulse 93, temperature 97.5 F (36.4 C), temperature source Oral, resp. rate 20, height 5' 4.5" (1.638 m), weight 273 lb (123.832 kg), last menstrual period 04/22/2011, SpO2 97.00%. Physical Exam Physical Exam  Vitals reviewed. Constitutional: She is oriented to person, place, and time. She appears well-developed and well-nourished.  HENT:  Head: Normocephalic and atraumatic.  Right Ear: External ear normal.  Left Ear: External ear normal.  Nose: Nose normal.  Mouth/Throat: Oropharynx is clear and moist.  Eyes: Conjunctivae and EOM are normal. Pupils are equal, round, and reactive to light. Right eye exhibits no discharge. Left eye exhibits no discharge. No scleral icterus.  Neck: Normal range of  motion. Neck supple. No tracheal deviation present. No thyromegaly present.  Cardiovascular: Normal rate, regular rhythm, normal heart sounds and intact distal pulses.  Exam reveals no gallop and  no friction rub.   No murmur heard. Respiratory: Effort normal and breath sounds normal. No respiratory distress. She has no wheezes. She has no rales. She exhibits no tenderness.  GI: Soft. Bowel sounds are normal. She exhibits no distension and no mass. There is tenderness. There is no rebound and no guarding.  Genitourinary:       Vulva is normal without lesions Vagina is pink moist without discharge Cervix normal in appearance and pap is normal Uterus is enlarged measures 42 Adnexa is negative with normal sized ovaries by sonogram  Musculoskeletal: Normal range of motion. She exhibits no edema and no tenderness.  Neurological: She is alert and oriented to person, place, and time. She has normal reflexes. She displays normal reflexes. No cranial nerve deficit. She exhibits normal muscle tone. Coordination normal.  Skin: Skin is warm and dry. No rash noted. No erythema. No pallor.  Psychiatric: She has a normal mood and affect. Her behavior is normal. Judgment and thought content normal.    Results for orders placed during the hospital encounter of 01/05/12 (from the past 24 hour(s))  PREPARE RBC (CROSSMATCH)     Status: Normal   Collection Time   01/04/12  3:21 PM      Component Value Range   Order Confirmation ORDER PROCESSED BY BLOOD BANK    TYPE AND SCREEN     Status: Normal (Preliminary result)   Collection Time   01/04/12  4:35 PM      Component Value Range   ABO/RH(D) O POS     Antibody Screen NEG     Sample Expiration 01/07/2012     Unit Number [id number]     Blood Component Type RED CELLS,LR     Unit division 00     Status of Unit ISSUED     Transfusion Status OK TO TRANSFUSE     Crossmatch Result Compatible     Unit Number [id number]     Blood Component Type RED CELLS,LR     Unit division 00     Status of Unit ISSUED     Transfusion Status OK TO TRANSFUSE     Crossmatch Result Compatible    CBC     Status: Abnormal   Collection Time   01/04/12  4:35 PM       Component Value Range   WBC 12.0 (*) 4.0 - 10.5 K/uL   RBC 3.89  3.87 - 5.11 MIL/uL   Hemoglobin 11.7 (*) 12.0 - 15.0 g/dL   HCT 41.3 (*) 24.4 - 01.0 %   MCV 90.7  78.0 - 100.0 fL   MCH 30.1  26.0 - 34.0 pg   MCHC 33.1  30.0 - 36.0 g/dL   RDW 27.2  53.6 - 64.4 %   Platelets 254  150 - 400 K/uL  RPR     Status: Normal   Collection Time   01/04/12  4:35 PM      Component Value Range   RPR NON REACTIVE  NON REACTIVE  PREPARE RBC (CROSSMATCH)     Status: Normal   Collection Time   01/05/12 10:58 AM      Component Value Range   Order Confirmation ORDER PROCESSED BY BLOOD BANK    SURGICAL PCR SCREEN     Status: Normal   Collection Time   01/05/12  11:00 AM      Component Value Range   MRSA, PCR NEGATIVE  NEGATIVE   Staphylococcus aureus NEGATIVE  NEGATIVE  URINALYSIS, ROUTINE W REFLEX MICROSCOPIC     Status: Abnormal   Collection Time   01/05/12 11:00 AM      Component Value Range   Color, Urine YELLOW  YELLOW   APPearance CLEAR  CLEAR   Specific Gravity, Urine >1.030 (*) 1.005 - 1.030   pH 6.0  5.0 - 8.0   Glucose, UA NEGATIVE  NEGATIVE mg/dL   Hgb urine dipstick NEGATIVE  NEGATIVE   Bilirubin Urine NEGATIVE  NEGATIVE   Ketones, ur NEGATIVE  NEGATIVE mg/dL   Protein, ur 161 (*) NEGATIVE mg/dL   Urobilinogen, UA 0.2  0.0 - 1.0 mg/dL   Nitrite NEGATIVE  NEGATIVE   Leukocytes, UA NEGATIVE  NEGATIVE  URINE MICROSCOPIC-ADD ON     Status: Abnormal   Collection Time   01/05/12 11:00 AM      Component Value Range   Squamous Epithelial / LPF MANY (*) RARE   WBC, UA 0-2  <3 WBC/hpf   Bacteria, UA FEW (*) RARE      Assessment/Plan: 1.  Mono/Di Twins at 1 6/7 weeks 2.  Mild pre eclampsia 3.  Growth restriction and discrepancy with Twin B >20% smaller than A and <3%tile 4.  Twin A breech  Proceed with primary Caesarean section.  Patient understands the risks and benefits and will proceed.  Haakon Titsworth H 01/05/2012, 1:08 PM

## 2012-01-05 NOTE — Anesthesia Postprocedure Evaluation (Signed)
Anesthesia Post Note  Patient: Nancy Russell  Procedure(s) Performed: Procedure(s) (LRB): CESAREAN SECTION (N/A)  Anesthesia type: Spinal  Patient location: PACU  Post pain: Pain level controlled  Post assessment: Post-op Vital signs reviewed  Last Vitals:  Filed Vitals:   01/05/12 1615  BP: 132/85  Pulse: 78  Temp:   Resp: 11    Post vital signs: Reviewed  Level of consciousness: awake  Complications: No apparent anesthesia complications

## 2012-01-05 NOTE — Anesthesia Preprocedure Evaluation (Addendum)
Anesthesia Evaluation  Patient identified by MRN, date of birth, ID band Patient awake    Reviewed: Allergy & Precautions, H&P , Patient's Chart, lab work & pertinent test results  Airway Mallampati: IV TM Distance: >3 FB Neck ROM: full    Dental No notable dental hx.    Pulmonary  breath sounds clear to auscultation  Pulmonary exam normal       Cardiovascular Exercise Tolerance: Good Rhythm:regular Rate:Normal     Neuro/Psych    GI/Hepatic   Endo/Other  Morbid obesity  Renal/GU      Musculoskeletal   Abdominal   Peds  Hematology   Anesthesia Other Findings Twins   Reproductive/Obstetrics                          Anesthesia Physical Anesthesia Plan  ASA: III  Anesthesia Plan: Combined Spinal and Epidural   Post-op Pain Management:    Induction:   Airway Management Planned:   Additional Equipment:   Intra-op Plan:   Post-operative Plan:   Informed Consent: I have reviewed the patients History and Physical, chart, labs and discussed the procedure including the risks, benefits and alternatives for the proposed anesthesia with the patient or authorized representative who has indicated his/her understanding and acceptance.   Dental Advisory Given  Plan Discussed with: CRNA  Anesthesia Plan Comments: (Lab work confirmed with CRNA in room. Platelets okay. Discussed spinal anesthetic, and patient consents to the procedure:  included risk of possible headache,backache, failed block, allergic reaction, and nerve injury. This patient was asked if she had any questions or concerns before the procedure started. )        Anesthesia Quick Evaluation

## 2012-01-05 NOTE — Anesthesia Procedure Notes (Signed)
Spinal  Patient location during procedure: OR Start time: 01/05/2012 1:46 PM Staffing Performed by: anesthesiologist  Preanesthetic Checklist Completed: patient identified, site marked, surgical consent, pre-op evaluation, timeout performed, IV checked, risks and benefits discussed and monitors and equipment checked Spinal Block Patient position: sitting Prep: site prepped and draped and DuraPrep Patient monitoring: cardiac monitor, continuous pulse ox, blood pressure and heart rate Approach: midline Location: L3-4 Injection technique: catheter Needle Needle type: Tuohy and Pencan  Needle gauge: 24 G Needle length: 12.7 cm Needle insertion depth: 7 cm Catheter type: closed end flexible Catheter size: 19 g Catheter at skin depth: 12 cm Additional Notes CSE for morbid obesity with twins and anticipated difficult airway.  LOR to air at 7 cm, Pencan via tuohy with immediate return of clear free flow CSF, no paresthesia.  Spinal dose given.  Pencan removed.  Tuohy flushed with 2 ml saline.  Epidural catheter threaded and secured at 12 cm at skin.  Epidural test dose NOT GIVEN.  Patient turned to supine.  Jasmine December, MD

## 2012-01-06 LAB — CBC
HCT: 31.9 % — ABNORMAL LOW (ref 36.0–46.0)
Hemoglobin: 10.7 g/dL — ABNORMAL LOW (ref 12.0–15.0)
MCH: 30.5 pg (ref 26.0–34.0)
MCHC: 33.5 g/dL (ref 30.0–36.0)

## 2012-01-06 NOTE — Progress Notes (Signed)
O2 Sat alarm indicating O2 Sat in the 80's w/spontaneous return to 90's. Pt sleeping - easily aroused. Lungs clear. 2L O2 via [place] placed on pt w/explanation.

## 2012-01-06 NOTE — Progress Notes (Signed)
Subjective: Postpartum Day 1: Cesarean Delivery Patient reports no complaints at al.    Objective: Vital signs in last 24 hours: Temp:  [97.5 F (36.4 C)-98.5 F (36.9 C)] 97.6 F (36.4 C) (12/07 0819) Pulse Rate:  [68-103] 96  (12/07 0819) Resp:  [11-20] 18  (12/07 0819) BP: (118-150)/(57-95) 131/74 mmHg (12/07 0819) SpO2:  [90 %-100 %] 99 % (12/07 0819) Weight:  [265 lb 4.8 oz (120.339 kg)-266 lb 1.6 oz (120.702 kg)] 266 lb 1.6 oz (120.702 kg) (12/07 0445)  Physical Exam:  General: alert, cooperative and no distress Lochia: appropriate Uterine Fundus: firm Incision: healing well DVT Evaluation: No evidence of DVT seen on physical exam.   Basename 01/06/12 0500 01/04/12 1635  HGB 10.7* 11.7*  HCT 31.9* 35.3*    Assessment/Plan: Status post Cesarean section. Doing well postoperatively.  Continue current care.  EURE,LUTHER H 01/06/2012, 9:28 AM

## 2012-01-06 NOTE — Progress Notes (Signed)
O2 Sat monitor intermittently & briefly alarming O2 Sats down to 87% spontaneously returning to 97-100%. Pt observed to be snoring loudly w/occ 5-10 second periods of apnea.

## 2012-01-06 NOTE — Progress Notes (Signed)
O2 Sat alarming intermittently & briefly down to 80's on RA - pt sleeping. O2 @2l /[place] reapplied.

## 2012-01-07 NOTE — Progress Notes (Addendum)
Subjective: Postpartum Day 2: Cesarean Delivery Twins s/p 24hrs pp magnesium Patient reports tolerating PO and no problems voiding.  Incisional soreness, relieved w/ meds.  No flatus yet. Denies ha, scotomata, ruq/epigastric pain, n/v.    Objective: Vital signs in last 24 hours: Temp:  [97.4 F (36.3 C)-98 F (36.7 C)] 97.9 F (36.6 C) (12/08 0504) Pulse Rate:  [85-99] 92  (12/08 0504) Resp:  [16-20] 18  (12/08 0504) BP: (131-158)/(71-96) 158/96 mmHg (12/08 0504) SpO2:  [98 %-99 %] 98 % (12/08 0504)  Physical Exam:  General: alert, cooperative and no distress Lochia: appropriate Uterine Fundus: firm Incision: healing well, no significant drainage, no dehiscence, no significant erythema DVT Evaluation: No evidence of DVT seen on physical exam. Negative Homan's sign. No cords or calf tenderness. No significant calf/ankle edema.   Basename 01/06/12 0500 01/04/12 1635  HGB 10.7* 11.7*  HCT 31.9* 35.3*    Assessment/Plan: Status post Cesarean section. Doing well postoperatively. Babies in room w/ mom. Breastfeeding. Undecided about contraception. LC has rounded on pt x 2 Continue current care Observe BPs to see if antihypertensive needed.   Marge Duncans 01/07/2012, 7:20 AM

## 2012-01-08 ENCOUNTER — Encounter (HOSPITAL_COMMUNITY): Payer: Self-pay | Admitting: Obstetrics & Gynecology

## 2012-01-08 LAB — TYPE AND SCREEN: Unit division: 0

## 2012-01-08 MED ORDER — OXYCODONE-ACETAMINOPHEN 5-325 MG PO TABS: 1.0000 | ORAL_TABLET | ORAL | Status: DC | PRN

## 2012-01-08 MED ORDER — DOCUSATE SODIUM 100 MG PO CAPS: 100.0000 mg | ORAL_CAPSULE | Freq: Two times a day (BID) | ORAL | Status: DC | PRN

## 2012-01-08 MED ORDER — AMLODIPINE BESYLATE 5 MG PO TABS
5.0000 mg | ORAL_TABLET | Freq: Every day | ORAL | Status: DC
  Administered 2012-01-08: 5 mg via ORAL
  Filled 2012-01-08: qty 1

## 2012-01-08 MED ORDER — IBUPROFEN 600 MG PO TABS: 600.0000 mg | ORAL_TABLET | Freq: Four times a day (QID) | ORAL | Status: DC

## 2012-01-08 MED ORDER — AMLODIPINE BESYLATE 5 MG PO TABS: 5.0000 mg | ORAL_TABLET | Freq: Every day | ORAL | Status: DC

## 2012-01-08 NOTE — Progress Notes (Signed)
UR chart review completed.  

## 2012-01-08 NOTE — Discharge Summary (Signed)
Obstetric Discharge Summary Reason for Admission: cesarean section secondary to mild preeclampsia, growth restriction and discrepancy with Twin B >20% smaller than A and <3%tile, Twin A breech    Prenatal Procedures: Preeclampsia Intrapartum Procedures: cesarean: low cervical, transverse Postpartum Procedures: Mag Complications-Operative and Postpartum: elevated BP  Hemoglobin  Date Value Range Status  01/06/2012 10.7* 12.0 - 15.0 g/dL Final     HCT  Date Value Range Status  01/06/2012 31.9* 36.0 - 46.0 % Final   Physical Exam:  General: alert, cooperative and no distress Lochia: appropriate Uterine Fundus: firm Incision: healing well, no significant drainage, no significant erythema DVT Evaluation: No evidence of DVT seen on physical exam.  Discharge Diagnoses: Preelampsia  Discharge Information: Date: 01/08/2012 Activity: pelvic rest Diet: routine Medications: PNV, Ibuprofen, Colace and Percocet Condition: stable Instructions: refer to practice specific booklet Discharge to: home  Follow-up Information    Follow up with [facility]. (scheduled appt for 12/13)    Contact information:   [address] [place] [address] [phone]         Newborn Data:   Brynna, Dobos Girl Sonam [[id number]]  Live born female  Birth Weight: 4 lb 9.9 oz (2095 g) APGAR: 8, 9   Leiloni, Smithers [[id number]]  Live born female  Birth Weight: 3 lb 15.9 oz (1810 g) APGAR: 8, 9  Twins to stay in NICU. Breast and bottle feeding. Unsure about contraception, plans to discuss with Dr. Despina Hidden at f/u appt on 12/13.   Corky Downs 01/08/2012, 7:50 AM

## 2012-01-08 NOTE — Discharge Summary (Signed)
I have seen and examined patient and agree with above. Patient started on 5 mg of amlodipine for elevated blood pressure and will f/u at [facility] in 1 to 2 days for BP check. She is asymptomatic - no headache, vision changes or RUQ pain.  Napoleon Form, MD

## 2012-02-14 ENCOUNTER — Other Ambulatory Visit: Payer: Self-pay | Admitting: Obstetrics & Gynecology

## 2012-02-14 NOTE — Op Note (Signed)
Preoperative diagnosis:  1.  Intrauterine pregnancy at 36 weeks and 6 days gestation                                         2.  Monochorionic/diamniotic twins                                         3.  Twin A Breech                                         4.  Significant growth discrepancy between Twin A and B with B >20% smaller than A                                         5.  Mild pre eclampsia   Postoperative diagnosis:  Same as above  Procedure:  Primary cesarean section  Surgeon:  Lazaro Arms MD  Assistant:    Anesthesia: Spinal  Findings:  As stated above the patient had twins at 36 weeks and 6 days gestation.  Twin A is in the breech presentation thereby making vaginal delivery inappropriate.  Additionally twin B is greater than 20% smaller than twin A on the last ultrasound.  Fluid volumes and umbilical cord Doppler studies have been normal.  Additionally the patient has developed mild preeclampsia.  Over a low transverse incision was delivered a viable female with Apgars of 8 and 9 weighing 4 lbs. 10 oz.  Twin B was delivered at 1411 was a female Apgars of 8/9 and weighed 3 pound 15.5 ounces.   Uterus, tubes and ovaries were all normal.  There were no other significant findings. Twin a was breech and twin B was vertex.  Description of operation:  Patient was taken to the operating room and placed in the sitting position where she underwent a spinal anesthetic. She was then placed in the supine position with tilt to the left side. When adequate anesthetic level was obtained she was prepped and draped in usual sterile fashion and a Foley catheter was placed. A Pfannenstiel skin incision was made and carried down sharply to the rectus fascia which was scored in the midline extended laterally. The fascia was taken off the muscles both superiorly and without difficulty. The muscles were divided.  The peritoneal cavity was entered.  Bladder blade was placed, no bladder flap was created  .Over a low transverse incision was delivered a viable female with Apgars of 8 and 9 weighing 4 lbs. 10 oz.  Twin B was delivered at 1411 was a female Apgars of 8/9 and weighed 3 pound 15.5 ounces.   Uterus, tubes and ovaries were all normal.  There were no other significant findings. Twin a was breech and twin B was vertex.   Cord pH was obtained and was . The uterus was exteriorized. It was closed in 2 layers, the first being a running interlocking layer and the second being an imbricating layer using 0 monocryl on a CTX needle. There was good resulting hemostasis. The uterus tubes and ovaries were all normal. Peritoneal cavity was irrigated vigorously.  The muscles and peritoneum were reapproximated loosely. The fascia was closed using 0 Vicryl in running fashion. Subcutaneous tissue was made hemostatic and irrigated. The skin was closed using 4-0 Vicryl on a Keith needle in a subcuticular fashion.  Dermabond was placed for additional wound integrity and to serve as a barrier. Blood loss for the procedure was 650 cc. The patient received 2  gram of Ancef prophylactically. The patient was taken to the recovery room in good stable condition with all counts being correct x3.  EBL 650 cc  Rosary Filosa H

## 2012-06-30 DIAGNOSIS — Z79899 Other long term (current) drug therapy: Secondary | ICD-10-CM | POA: Insufficient documentation

## 2012-06-30 DIAGNOSIS — R599 Enlarged lymph nodes, unspecified: Secondary | ICD-10-CM | POA: Insufficient documentation

## 2012-06-30 DIAGNOSIS — H538 Other visual disturbances: Secondary | ICD-10-CM | POA: Insufficient documentation

## 2012-06-30 DIAGNOSIS — R51 Headache: Secondary | ICD-10-CM | POA: Insufficient documentation

## 2012-07-01 ENCOUNTER — Encounter (HOSPITAL_COMMUNITY): Payer: Self-pay

## 2012-07-01 ENCOUNTER — Emergency Department (HOSPITAL_COMMUNITY)
Admission: EM | Admit: 2012-07-01 | Discharge: 2012-07-01 | Disposition: A | Discharge status: Home / Self Care | Payer: Medicaid Other | Attending: Emergency Medicine | Admitting: Emergency Medicine

## 2012-07-01 DIAGNOSIS — R599 Enlarged lymph nodes, unspecified: Secondary | ICD-10-CM

## 2012-07-01 MED ORDER — HYDROCODONE-ACETAMINOPHEN 5-325 MG PO TABS: 2.0000 | ORAL_TABLET | ORAL | Status: DC | PRN

## 2012-07-01 MED ORDER — HYDROCODONE-ACETAMINOPHEN 5-325 MG PO TABS
1.0000 | ORAL_TABLET | Freq: Once | ORAL | Status: AC
  Administered 2012-07-01: 1 via ORAL
  Filled 2012-07-01: qty 1

## 2012-07-01 MED ORDER — KETOROLAC TROMETHAMINE 60 MG/2ML IM SOLN
60.0000 mg | Freq: Once | INTRAMUSCULAR | Status: AC
  Administered 2012-07-01: 60 mg via INTRAMUSCULAR
  Filled 2012-07-01: qty 2

## 2012-07-01 NOTE — ED Provider Notes (Signed)
History     CSN: [id number]  Arrival date & time 06/30/12  2359   First MD Initiated Contact with Patient 07/01/12 0006      Chief Complaint  Patient presents with  . Headache    (Consider location/radiation/quality/duration/timing/severity/associated sxs/prior treatment) Patient is a 22 y.o. female presenting with headaches. The history is provided by the patient.  Headache Pain location:  Generalized Radiates to:  L neck Severity currently:  7/10 Onset quality:  Gradual Duration:  1 day Timing:  Constant Progression:  Unchanged Chronicity:  New Similar to prior headaches: no   Relieved by:  Nothing Worsened by:  Nothing tried Ineffective treatments:  NSAIDs and prescription medications Associated symptoms: blurred vision (right yesterday but none today)   Associated symptoms: no ear pain, no pain, no facial pain, no fever, no neck stiffness, no sinus pressure and no vomiting  Neck pain: left side.    Nancy Russell is a 22 y.o. female who presents to the ED with a headache. The headache started yesterday. She went to the beach 3 days ago and noted a knot on the back of her head on the left side. She did not have a headache at that time.   Past Medical History  Diagnosis Date  . [id number](027.2)     Past Surgical History  Procedure Laterality Date  . Cystectomy  at age 76    cyst removed from my chest   . Cesarean section  01/05/2012    Procedure: CESAREAN SECTION;  Surgeon: Lazaro Arms, MD;  Location: [facility];  Service: Obstetrics;  Laterality: N/A;    Family History  Problem Relation Age of Onset  . Anesthesia problems Neg Hx   . Hypotension Neg Hx   . Malignant hyperthermia Neg Hx   . Pseudochol deficiency Neg Hx   . Other Neg Hx     History  Substance Use Topics  . Smoking status: Never Smoker   . Smokeless tobacco: Never Used  . Alcohol Use: No    OB History   Grav Para Term Preterm Abortions TAB SAB Ect Mult Living   1 1 0 1 0 0 0 0 1 2        Review of Systems  Constitutional: Negative for fever.  HENT: Negative for ear pain, neck stiffness and sinus pressure. Neck pain: left side.   Eyes: Positive for blurred vision (right yesterday but none today). Negative for pain.  Gastrointestinal: Negative for vomiting.  Neurological: Positive for headaches.    Allergies  Review of patient's allergies indicates no known allergies.  Home Medications   Current Outpatient Rx  Name  Route  Sig  Dispense  Refill  . ibuprofen (ADVIL,MOTRIN) 600 MG tablet   Oral   Take 1 tablet (600 mg total) by mouth every 6 (six) hours.   30 tablet   1   . traMADol (ULTRAM) 50 MG tablet   Oral   Take 50 mg by mouth every 6 (six) hours as needed for pain.         Marland Kitchen amLODipine (NORVASC) 5 MG tablet   Oral   Take 1 tablet (5 mg total) by mouth daily.   30 tablet   0   . docusate sodium (COLACE) 100 MG capsule   Oral   Take 1 capsule (100 mg total) by mouth 2 (two) times daily as needed for constipation.   30 capsule   2   . oxyCODONE-acetaminophen (PERCOCET/ROXICET) 5-325 MG per tablet   Oral  Take 1-2 tablets by mouth every 4 (four) hours as needed (moderate - severe pain).   30 tablet   0   . Prenatal Vit-Fe Fumarate-FA (PRENATAL MULTIVITAMIN) TABS   Oral   Take 1 tablet by mouth every evening.            BP 129/53  Pulse 72  Temp(Src) 97.8 F (36.6 C) (Oral)  Resp 20  Ht 5\' 5"  (1.651 m)  Wt 260 lb (117.935 kg)  BMI 43.27 kg/m2  SpO2 97%  LMP 06/30/2012  Physical Exam  Nursing note and vitals reviewed. Constitutional: She is oriented to person, place, and time. She appears well-developed and well-nourished. No distress.  HENT:  Head: Normocephalic.    Right Ear: Tympanic membrane normal.  Left Ear: Tympanic membrane normal.  Nose: Nose normal.  Mouth/Throat: Uvula is midline, oropharynx is clear and moist and mucous membranes are normal.  Tender enlarged lymph node left.  Eyes: Conjunctivae and EOM are  normal. Pupils are equal, round, and reactive to light. Right eye exhibits no nystagmus. Left eye exhibits no nystagmus.  Neck: Normal range of motion. Neck supple.  Cardiovascular: Normal rate and regular rhythm.   Pulmonary/Chest: Effort normal and breath sounds normal.  Abdominal: Soft. There is no tenderness.  Musculoskeletal: Normal range of motion.  Neurological: She is alert and oriented to person, place, and time. No cranial nerve deficit.  Skin: Skin is warm and dry.  Psychiatric: She has a normal mood and affect. Her behavior is normal.    ED Course  Procedures (including critical care time)  MDM: discussed this case with Dr. Colon Branch and we will treat the patient's headache and she is to follow up with her PCP about the enlarged lymph node.  22 y.o. with headache. Offered pain medication while in the ED but she states she is driving and does not have a ride. Will give pre pack to go home.  Discussed with the patient clinical findings and plan of care. All questioned fully answered.     Medication List    TAKE these medications       HYDROcodone-acetaminophen 5-325 MG per tablet  Commonly known as:  NORCO/VICODIN  Take 2 tablets by mouth every 4 (four) hours as needed for pain.      ASK your doctor about these medications       amLODipine 5 MG tablet  Commonly known as:  NORVASC  Take 1 tablet (5 mg total) by mouth daily.     docusate sodium 100 MG capsule  Commonly known as:  COLACE  Take 1 capsule (100 mg total) by mouth 2 (two) times daily as needed for constipation.     ibuprofen 600 MG tablet  Commonly known as:  ADVIL,MOTRIN  Take 1 tablet (600 mg total) by mouth every 6 (six) hours.     oxyCODONE-acetaminophen 5-325 MG per tablet  Commonly known as:  PERCOCET/ROXICET  Take 1-2 tablets by mouth every 4 (four) hours as needed (moderate - severe pain).     prenatal multivitamin Tabs  Take 1 tablet by mouth every evening.     traMADol 50 MG tablet   Commonly known as:  ULTRAM  Take 50 mg by mouth every 6 (six) hours as needed for pain.               [place], [place] 07/01/12 1907

## 2012-07-01 NOTE — ED Notes (Signed)
Pt c/o headache since Monday, also states she has a knot to left posterior scalp that is sore for several days.

## 2012-07-02 NOTE — ED Provider Notes (Signed)
I personally performed the services described in this documentation, which was scribed in my presence. The recorded information has been reviewed and is accurate.  Nicoletta Dress. Colon Branch, MD 07/02/12 [phone]

## 2012-09-11 ENCOUNTER — Encounter (HOSPITAL_COMMUNITY): Payer: Self-pay

## 2012-09-11 ENCOUNTER — Emergency Department (HOSPITAL_COMMUNITY)
Admission: EM | Admit: 2012-09-11 | Discharge: 2012-09-11 | Disposition: A | Discharge status: Home / Self Care | Payer: Medicaid Other | Attending: Emergency Medicine | Admitting: Emergency Medicine

## 2012-09-11 ENCOUNTER — Emergency Department (HOSPITAL_COMMUNITY): Payer: Medicaid Other

## 2012-09-11 DIAGNOSIS — Z3202 Encounter for pregnancy test, result negative: Secondary | ICD-10-CM | POA: Insufficient documentation

## 2012-09-11 DIAGNOSIS — H538 Other visual disturbances: Secondary | ICD-10-CM | POA: Insufficient documentation

## 2012-09-11 DIAGNOSIS — R079 Chest pain, unspecified: Secondary | ICD-10-CM

## 2012-09-11 DIAGNOSIS — R110 Nausea: Secondary | ICD-10-CM | POA: Insufficient documentation

## 2012-09-11 DIAGNOSIS — R209 Unspecified disturbances of skin sensation: Secondary | ICD-10-CM | POA: Insufficient documentation

## 2012-09-11 DIAGNOSIS — H53149 Visual discomfort, unspecified: Secondary | ICD-10-CM | POA: Insufficient documentation

## 2012-09-11 DIAGNOSIS — R51 Headache: Secondary | ICD-10-CM | POA: Insufficient documentation

## 2012-09-11 DIAGNOSIS — R200 Anesthesia of skin: Secondary | ICD-10-CM

## 2012-09-11 DIAGNOSIS — R519 Headache, unspecified: Secondary | ICD-10-CM

## 2012-09-11 DIAGNOSIS — R0602 Shortness of breath: Secondary | ICD-10-CM | POA: Insufficient documentation

## 2012-09-11 LAB — CBC WITH DIFFERENTIAL/PLATELET
Basophils Absolute: 0 10*3/uL (ref 0.0–0.1)
Basophils Relative: 0 % (ref 0–1)
Eosinophils Absolute: 0.1 10*3/uL (ref 0.0–0.7)
Hemoglobin: 14.4 g/dL (ref 12.0–15.0)
MCH: 30.1 pg (ref 26.0–34.0)
MCHC: 33.8 g/dL (ref 30.0–36.0)
Monocytes Absolute: 0.7 10*3/uL (ref 0.1–1.0)
Monocytes Relative: 6 % (ref 3–12)
Neutro Abs: 7.6 10*3/uL (ref 1.7–7.7)
Neutrophils Relative %: 60 % (ref 43–77)
RDW: 12.6 % (ref 11.5–15.5)

## 2012-09-11 LAB — COMPREHENSIVE METABOLIC PANEL
AST: 34 U/L (ref 0–37)
Albumin: 4.1 g/dL (ref 3.5–5.2)
BUN: 12 mg/dL (ref 6–23)
Chloride: 100 mEq/L (ref 96–112)
Creatinine, Ser: 0.8 mg/dL (ref 0.50–1.10)
Potassium: 4.2 mEq/L (ref 3.5–5.1)
Total Protein: 7.8 g/dL (ref 6.0–8.3)

## 2012-09-11 LAB — D-DIMER, QUANTITATIVE: D-Dimer, Quant: 2.56 ug/mL-FEU — ABNORMAL HIGH (ref 0.00–0.48)

## 2012-09-11 LAB — TROPONIN I: Troponin I: <0.3 ng/mL (ref <0.30)

## 2012-09-11 LAB — POCT PREGNANCY, URINE: Preg Test, Ur: NEGATIVE

## 2012-09-11 MED ORDER — IOHEXOL 350 MG/ML SOLN
100.0000 mL | Freq: Once | INTRAVENOUS | Status: AC | PRN
  Administered 2012-09-11: 100 mL via INTRAVENOUS

## 2012-09-11 MED ORDER — KETOROLAC TROMETHAMINE 30 MG/ML IJ SOLN
30.0000 mg | Freq: Once | INTRAMUSCULAR | Status: AC
  Administered 2012-09-11: 30 mg via INTRAVENOUS
  Filled 2012-09-11: qty 1

## 2012-09-11 MED ORDER — SODIUM CHLORIDE 0.9 % IV SOLN
1000.0000 mL | INTRAVENOUS | Status: DC
  Administered 2012-09-11: 1000 mL via INTRAVENOUS

## 2012-09-11 MED ORDER — MECLIZINE HCL 12.5 MG PO TABS
25.0000 mg | ORAL_TABLET | Freq: Once | ORAL | Status: AC
  Administered 2012-09-11: 25 mg via ORAL
  Filled 2012-09-11: qty 2

## 2012-09-11 MED ORDER — METOCLOPRAMIDE HCL 5 MG/ML IJ SOLN
10.0000 mg | Freq: Once | INTRAMUSCULAR | Status: AC
  Administered 2012-09-11: 10 mg via INTRAVENOUS
  Filled 2012-09-11: qty 2

## 2012-09-11 MED ORDER — DIPHENHYDRAMINE HCL 50 MG/ML IJ SOLN
25.0000 mg | Freq: Once | INTRAMUSCULAR | Status: AC
  Administered 2012-09-11: 25 mg via INTRAVENOUS
  Filled 2012-09-11: qty 1

## 2012-09-11 MED ORDER — SODIUM CHLORIDE 0.9 % IV SOLN
1000.0000 mL | Freq: Once | INTRAVENOUS | Status: AC
  Administered 2012-09-11: 1000 mL via INTRAVENOUS

## 2012-09-11 MED ORDER — ISOMETHEPTENE-APAP-DICHLORAL 65-325-100 MG PO CAPS: ORAL_CAPSULE | ORAL | Status: DC

## 2012-09-11 NOTE — ED Provider Notes (Signed)
CSN: [id number]     Arrival date & time 09/11/12  1736 History  This chart was scribed for Ward Givens, MD by Greggory Stallion, ED Scribe. This patient was seen in room APA15/APA15 and the patient's care was started at 5:46 PM.   Chief Complaint  Patient presents with  . Dizziness  . Chest Pain   The history is provided by the patient. No language interpreter was used.    HPI Comments: Nancy Russell is a 22 y.o. female who presents to the Emergency Department complaining of gradual onset, intermittent episodes of a pressure headache that is diffuse with associated dizziness that started yesterday morning. She rates the headache 10/10 currently. The dizziness typically occurs when she is standing. Pt is also having nausea, with blurred vision off and on  and chest tightness that lasts 5-10 minutes. Pt feels SOB and feels like she is going to lose consciousness at times but never does. She is having some blurry vision with the headache and some numbness in her left hand that normally last 2-3 minutes. Light will worsen the headache, but not noise. She feels like she is spinning and falling and she doesn't feel like she is falling in the same direction. This sensation lasts a few minutes. Pt has taken ibuprofen with no relief. Pt denies fever, cough, leg pain, leg swelling and emesis as associated symptoms. She is under no new stress. Pt has the birth control implant in her arm since January. She denies this headache being like prior HA and doesn't recall her ED visit in June for HA. She denies any new stress.   Pt does not smoke cigarettes or drink alcohol.  No family history of CAD, stroke or blood clots.   No PCP  Past Medical History  Diagnosis Date  . Headache(784.0)   preelampsia   Past Surgical History  Procedure Laterality Date  . Cystectomy  at age 37    cyst removed from my chest   . Cesarean section  01/05/2012    Procedure: CESAREAN SECTION;  Surgeon: Lazaro Arms, MD;   Location: [facility];  Service: Obstetrics;  Laterality: N/A;   Family History  Problem Relation Age of Onset  . Anesthesia problems Neg Hx   . Hypotension Neg Hx   . Malignant hyperthermia Neg Hx   . Pseudochol deficiency Neg Hx   . Other Neg Hx    History  Substance Use Topics  . Smoking status: Never Smoker   . Smokeless tobacco: Never Used  . Alcohol Use: No   employed   OB History   Grav Para Term Preterm Abortions TAB SAB Ect Mult Living   1 1 0 1 0 0 0 0 1 2      Review of Systems  Constitutional: Negative for fever.  Eyes: Positive for photophobia and visual disturbance.  Respiratory: Positive for chest tightness and shortness of breath. Negative for cough.   Cardiovascular: Negative for leg swelling.  Gastrointestinal: Positive for nausea.  Musculoskeletal: Negative for myalgias.  Neurological: Positive for dizziness, numbness and headaches.  All other systems reviewed and are negative.    Allergies  Review of patient's allergies indicates no known allergies.  Home Medications   Current Outpatient Rx  Name  Route  Sig  Dispense  Refill  . amLODipine (NORVASC) 5 MG tablet   Oral   Take 1 tablet (5 mg total) by mouth daily.   30 tablet   0   . docusate sodium (COLACE) 100  MG capsule   Oral   Take 1 capsule (100 mg total) by mouth 2 (two) times daily as needed for constipation.   30 capsule   2   . HYDROcodone-acetaminophen (NORCO/VICODIN) 5-325 MG per tablet   Oral   Take 2 tablets by mouth every 4 (four) hours as needed for pain.   6 tablet   0   . ibuprofen (ADVIL,MOTRIN) 600 MG tablet   Oral   Take 1 tablet (600 mg total) by mouth every 6 (six) hours.   30 tablet   1   . oxyCODONE-acetaminophen (PERCOCET/ROXICET) 5-325 MG per tablet   Oral   Take 1-2 tablets by mouth every 4 (four) hours as needed (moderate - severe pain).   30 tablet   0   . Prenatal Vit-Fe Fumarate-FA (PRENATAL MULTIVITAMIN) TABS   Oral   Take 1 tablet by mouth every  evening.          . traMADol (ULTRAM) 50 MG tablet   Oral   Take 50 mg by mouth every 6 (six) hours as needed for pain.          BP 131/67  Pulse 101  Temp(Src) 97.5 F (36.4 C) (Oral)  Resp 18  Ht 5\' 6"  (1.676 m)  Wt 250 lb (113.399 kg)  BMI 40.37 kg/m2  SpO2 100%  Breastfeeding? No  Vital signs normal except tachycardia   Physical Exam  Nursing note and vitals reviewed. Constitutional: She is oriented to person, place, and time. She appears well-developed and well-nourished.  Non-toxic appearance. She does not appear ill. No distress.  HENT:  Head: Normocephalic and atraumatic.  Right Ear: External ear normal.  Left Ear: External ear normal.  Nose: Nose normal. No mucosal edema or rhinorrhea.  Mouth/Throat: Oropharynx is clear and moist and mucous membranes are normal. No dental abscesses or edematous.  Tongue is mildly dry.   Eyes: Conjunctivae and EOM are normal. Pupils are equal, round, and reactive to light.  Neck: Normal range of motion and full passive range of motion without pain. Neck supple.  Cardiovascular: Normal rate, regular rhythm and normal heart sounds.  Exam reveals no gallop and no friction rub.   No murmur heard. Pulmonary/Chest: Effort normal and breath sounds normal. No respiratory distress. She has no wheezes. She has no rhonchi. She has no rales. She exhibits no tenderness and no crepitus.  Abdominal: Soft. Normal appearance and bowel sounds are normal. She exhibits no distension. There is no tenderness. There is no rebound and no guarding.  Musculoskeletal: Normal range of motion. She exhibits no edema and no tenderness.  Moves all extremities well.   Neurological: She is alert and oriented to person, place, and time. She has normal strength. No cranial nerve deficit.  Skin: Skin is warm, dry and intact. No rash noted. No erythema. No pallor.  Psychiatric: She has a normal mood and affect. Her speech is normal and behavior is normal. Her mood  appears not anxious.    ED Course   Procedures (including critical care time)  Medications  0.9 %  sodium chloride infusion (0 mL Intravenous Stopped 09/11/12 2054)    Followed by  0.9 %  sodium chloride infusion (1,000 mL Intravenous New Bag/Given 09/11/12 2058)  metoCLOPramide (REGLAN) injection 10 mg (10 mg Intravenous Given 09/11/12 1915)  diphenhydrAMINE (BENADRYL) injection 25 mg (25 mg Intravenous Given 09/11/12 1914)  ketorolac (TORADOL) 30 MG/ML injection 30 mg (30 mg Intravenous Given 09/11/12 1915)  meclizine (ANTIVERT) tablet 25 mg (  25 mg Oral Given 09/11/12 1915)  iohexol (OMNIPAQUE) 350 MG/ML injection 100 mL (100 mL Intravenous Contrast Given 09/11/12 2045)    DIAGNOSTIC STUDIES: Oxygen Saturation is 100% on RA, normal by my interpretation.    COORDINATION OF CARE: 6:36 PM-Discussed treatment plan which includes labs, medications and IV fluids with pt at bedside and pt agreed to plan.   Recheck 20:00 discussed need to pursue PE or TIA with CT angio chest and MR and she is agreeable. She states her headache and chest pain are improved.   10:53 PM-Advised pt no pulmonary embolism is present but there is a cyst, present over her medial left clavicle, she states she had had it removed before and it has grown back. Headache and chest pain are still gone. Feels ready to go home.      Results for orders placed during the hospital encounter of 09/11/12  CBC WITH DIFFERENTIAL      Result Value Range   WBC 12.7 (*) 4.0 - 10.5 K/uL   RBC 4.78  3.87 - 5.11 MIL/uL   Hemoglobin 14.4  12.0 - 15.0 g/dL   HCT 16.1  09.6 - 04.5 %   MCV 89.1  78.0 - 100.0 fL   MCH 30.1  26.0 - 34.0 pg   MCHC 33.8  30.0 - 36.0 g/dL   RDW 40.9  81.1 - 91.4 %   Platelets 334  150 - 400 K/uL   Neutrophils Relative % 60  43 - 77 %   Neutro Abs 7.6  1.7 - 7.7 K/uL   Lymphocytes Relative 34  12 - 46 %   Lymphs Abs 4.3 (*) 0.7 - 4.0 K/uL   Monocytes Relative 6  3 - 12 %   Monocytes Absolute 0.7  0.1 -  1.0 K/uL   Eosinophils Relative 1  0 - 5 %   Eosinophils Absolute 0.1  0.0 - 0.7 K/uL   Basophils Relative 0  0 - 1 %   Basophils Absolute 0.0  0.0 - 0.1 K/uL  COMPREHENSIVE METABOLIC PANEL      Result Value Range   Sodium 136  135 - 145 mEq/L   Potassium 4.2  3.5 - 5.1 mEq/L   Chloride 100  96 - 112 mEq/L   CO2 27  19 - 32 mEq/L   Glucose, Bld 92  70 - 99 mg/dL   BUN 12  6 - 23 mg/dL   Creatinine, Ser 7.82  0.50 - 1.10 mg/dL   Calcium 9.3  8.4 - 95.6 mg/dL   Total Protein 7.8  6.0 - 8.3 g/dL   Albumin 4.1  3.5 - 5.2 g/dL   AST 34  0 - 37 U/L   ALT 25  0 - 35 U/L   Alkaline Phosphatase 56  39 - 117 U/L   Total Bilirubin 0.7  0.3 - 1.2 mg/dL   GFR calc non Af Amer >90  >90 mL/min   GFR calc Af Amer >90  >90 mL/min  TROPONIN I      Result Value Range   Troponin I <0.30  <0.30 ng/mL  D-DIMER, QUANTITATIVE      Result Value Range   D-Dimer, Quant 2.56 (*) 0.00 - 0.48 ug/mL-FEU  MAGNESIUM      Result Value Range   Magnesium 2.0  1.5 - 2.5 mg/dL  POCT PREGNANCY, URINE      Result Value Range   Preg Test, Ur NEGATIVE  NEGATIVE   Laboratory interpretation all normal except elevated ddimer,  stable leukocytosis   Ct Angio Chest W/cm &/or Wo Cm  09/11/2012   *RADIOLOGY REPORT*  Clinical Data: Headaches with dizziness, chest pain and chest tightness for 2 days.  Elevated D-dimer levels.  Question pulmonary embolism.  CT ANGIOGRAPHY CHEST  Technique:  Multidetector CT imaging of the chest using the standard protocol during bolus administration of intravenous contrast. Multiplanar reconstructed images including MIPs were obtained and reviewed to evaluate the vascular anatomy.  Contrast: OMNIPAQUE IOHEXOL 350 MG/ML SOLN  Comparison: Chest radiographs 01/21/2010.  Findings: The pulmonary arteries are well opacified with contrast. There is no evidence of acute pulmonary embolism.  The thoracic aorta appears normal.  Ill-defined soft tissue in the prevascular space likely represents  residual thymic tissue.  There is a small amount of pericardial fluid or thickening.  No significant pleural effusion is seen.  There are no enlarged mediastinal or hilar lymph nodes.  The lungs are clear.  There is a low-density 1.3 cm lesion in the subcutaneous fat anterior to the left clavicular head, likely a sebaceous cyst.  The visualized upper abdomen appears unremarkable.  IMPRESSION:  1.  No evidence of acute pulmonary embolism or other acute chest process. 2.  Probable sebaceous cyst anterior to the left clavicular head - correlate clinically.   Original Report Authenticated By: Carey Bullocks, M.D.   Mr Brain Wo Contrast  09/11/2012   *RADIOLOGY REPORT*  Clinical Data: Left arm numbness and headache.  Question stroke.  MRI HEAD WITHOUT CONTRAST  Technique:  Multiplanar, multiecho pulse sequences of the brain and surrounding structures were obtained according to standard protocol without intravenous contrast.  Comparison: None.  Findings:  Calvarium and upper cervical spine: No marrow signal abnormality.  Orbits: No significant findings.  Sinuses: Clear. Mastoid and middle ears are clear.  Brain: No acute abnormality such as acute infarct, hemorrhage, hydrocephalus, or mass lesion.  No evidence of large vessel occlusion.  IMPRESSION: Negative brain MRI. No infarct or other abnormality.   Original Report Authenticated By: Tiburcio Pea      Date: 09/11/2012  Rate: 89  Rhythm: normal sinus rhythm  QRS Axis: normal  Intervals: normal  ST/T Wave abnormalities: normal  Conduction Disutrbances:none  Narrative Interpretation:   Old EKG Reviewed: unchanged from 02/28/2011 HR was 57    1. Headache   2. Chest pain   3. Numbness and tingling in left arm     New Prescriptions   ISOMETHEPTENE-ACETAMINOPHEN-DICHLORALPHENAZONE (MIDRIN) 65-325-100 MG CAPSULE    Headache dosing: q 4 hours prn, maximum 8 capsules/day. Migraine dosing: q 1 hour prn until relieved, maximum 5 capsules/12 hours.      Plan discharge   Devoria Albe, MD, FACEP   MDM   I personally performed the services described in this documentation, which was scribed in my presence. The recorded information has been reviewed and considered.  Devoria Albe, MD, Armando Gang   Ward Givens, MD 09/11/12 [phone]

## 2012-09-11 NOTE — ED Notes (Signed)
Pt reports since yesterday has had several episodes of chest pain, dizziness, lightheadedness, headache, and nausea.  Says the episodes come and go.

## 2012-09-11 NOTE — ED Notes (Signed)
States she feels like she may be pregnant

## 2013-03-27 ENCOUNTER — Emergency Department (HOSPITAL_COMMUNITY)
Admission: EM | Admit: 2013-03-27 | Discharge: 2013-03-27 | Disposition: A | Discharge status: Home / Self Care | Payer: Medicaid Other | Attending: Emergency Medicine | Admitting: Emergency Medicine

## 2013-03-27 ENCOUNTER — Encounter (HOSPITAL_COMMUNITY): Payer: Self-pay | Admitting: Emergency Medicine

## 2013-03-27 DIAGNOSIS — K5289 Other specified noninfective gastroenteritis and colitis: Secondary | ICD-10-CM | POA: Insufficient documentation

## 2013-03-27 DIAGNOSIS — K529 Noninfective gastroenteritis and colitis, unspecified: Secondary | ICD-10-CM

## 2013-03-27 DIAGNOSIS — R079 Chest pain, unspecified: Secondary | ICD-10-CM | POA: Insufficient documentation

## 2013-03-27 LAB — CBC WITH DIFFERENTIAL/PLATELET
BASOS ABS: 0 10*3/uL (ref 0.0–0.1)
BASOS PCT: 0 % (ref 0–1)
EOS ABS: 0 10*3/uL (ref 0.0–0.7)
EOS PCT: 0 % (ref 0–5)
HEMATOCRIT: 42.5 % (ref 36.0–46.0)
Hemoglobin: 14.4 g/dL (ref 12.0–15.0)
Lymphocytes Relative: 5 % — ABNORMAL LOW (ref 12–46)
Lymphs Abs: 0.5 10*3/uL — ABNORMAL LOW (ref 0.7–4.0)
MCH: 30.6 pg (ref 26.0–34.0)
MCHC: 33.9 g/dL (ref 30.0–36.0)
MCV: 90.2 fL (ref 78.0–100.0)
MONO ABS: 0.4 10*3/uL (ref 0.1–1.0)
Monocytes Relative: 3 % (ref 3–12)
Neutro Abs: 11 10*3/uL — ABNORMAL HIGH (ref 1.7–7.7)
Neutrophils Relative %: 92 % — ABNORMAL HIGH (ref 43–77)
Platelets: 303 10*3/uL (ref 150–400)
RBC: 4.71 MIL/uL (ref 3.87–5.11)
RDW: 12.7 % (ref 11.5–15.5)
WBC: 11.9 10*3/uL — ABNORMAL HIGH (ref 4.0–10.5)

## 2013-03-27 LAB — COMPREHENSIVE METABOLIC PANEL
ALBUMIN: 4.1 g/dL (ref 3.5–5.2)
ALT: 19 U/L (ref 0–35)
AST: 25 U/L (ref 0–37)
Alkaline Phosphatase: 59 U/L (ref 39–117)
BUN: 16 mg/dL (ref 6–23)
CALCIUM: 8.9 mg/dL (ref 8.4–10.5)
CO2: 25 mEq/L (ref 19–32)
CREATININE: 0.7 mg/dL (ref 0.50–1.10)
Chloride: 99 mEq/L (ref 96–112)
GFR calc Af Amer: >90 mL/min (ref >90)
GFR calc non Af Amer: >90 mL/min (ref >90)
Glucose, Bld: 105 mg/dL — ABNORMAL HIGH (ref 70–99)
Potassium: 3.8 mEq/L (ref 3.7–5.3)
Sodium: 139 mEq/L (ref 137–147)
Total Bilirubin: 1.3 mg/dL — ABNORMAL HIGH (ref 0.3–1.2)
Total Protein: 8.1 g/dL (ref 6.0–8.3)

## 2013-03-27 MED ORDER — ONDANSETRON 4 MG PO TBDP: ORAL_TABLET | ORAL | Status: DC

## 2013-03-27 MED ORDER — SODIUM CHLORIDE 0.9 % IV BOLUS (SEPSIS)
1000.0000 mL | Freq: Once | INTRAVENOUS | Status: AC
Start: 2013-03-27 — End: 2013-03-27
  Administered 2013-03-27: 1000 mL via INTRAVENOUS

## 2013-03-27 MED ORDER — KETOROLAC TROMETHAMINE 30 MG/ML IJ SOLN
30.0000 mg | Freq: Once | INTRAMUSCULAR | Status: AC
  Administered 2013-03-27: 30 mg via INTRAVENOUS
  Filled 2013-03-27: qty 1

## 2013-03-27 MED ORDER — ONDANSETRON HCL 4 MG/2ML IJ SOLN
4.0000 mg | Freq: Once | INTRAMUSCULAR | Status: AC
  Administered 2013-03-27: 4 mg via INTRAVENOUS
  Filled 2013-03-27: qty 2

## 2013-03-27 MED ORDER — DICYCLOMINE HCL 20 MG PO TABS: ORAL_TABLET | ORAL | Status: DC

## 2013-03-27 NOTE — ED Notes (Signed)
Pt c/o generalized chest pain x2 days as well as n/v/d since this morning. Pt denies cough, denies fever.

## 2013-03-27 NOTE — Discharge Instructions (Signed)
Drink plenty of fluids and follow up if not improving. °

## 2013-03-27 NOTE — ED Provider Notes (Signed)
CSN: [id number]     Arrival date & time 03/27/13  1744 History   First MD Initiated Contact with Patient 03/27/13 1830     Chief Complaint  Patient presents with  . Chest Pain  . Emesis  . Diarrhea     (Consider location/radiation/quality/duration/timing/severity/associated sxs/prior Treatment) Patient is a 23 y.o. female presenting with vomiting. The history is provided by the patient (pt complains of diarhea and vomiting). No language interpreter was used.  Emesis Severity:  Moderate Timing:  Constant Quality:  Bilious material Able to tolerate:  Liquids Progression:  Unchanged Chronicity:  New Recent urination:  Increased Associated symptoms: diarrhea   Associated symptoms: no abdominal pain and no headaches     Past Medical History  Diagnosis Date  . WUJWJXBJ(478.2)    Past Surgical History  Procedure Laterality Date  . Cystectomy  at age 53    cyst removed from my chest   . Cesarean section  01/05/2012    Procedure: CESAREAN SECTION;  Surgeon: Lazaro Arms, MD;  Location: [facility];  Service: Obstetrics;  Laterality: N/A;   Family History  Problem Relation Age of Onset  . Anesthesia problems Neg Hx   . Hypotension Neg Hx   . Malignant hyperthermia Neg Hx   . Pseudochol deficiency Neg Hx   . Other Neg Hx    History  Substance Use Topics  . Smoking status: Never Smoker   . Smokeless tobacco: Never Used  . Alcohol Use: No   OB History   Grav Para Term Preterm Abortions TAB SAB Ect Mult Living   1 1 0 1 0 0 0 0 1 2      Review of Systems  Constitutional: Negative for appetite change and fatigue.  HENT: Negative for congestion, ear discharge and sinus pressure.   Eyes: Negative for discharge.  Respiratory: Negative for cough.   Cardiovascular: Negative for chest pain.  Gastrointestinal: Positive for vomiting and diarrhea. Negative for abdominal pain.  Genitourinary: Negative for frequency and hematuria.  Musculoskeletal: Negative for back pain.  Skin:  Negative for rash.  Neurological: Negative for seizures and headaches.  Psychiatric/Behavioral: Negative for hallucinations.      Allergies  Review of patient's allergies indicates no known allergies.  Home Medications   Current Outpatient Rx  Name  Route  Sig  Dispense  Refill  . dicyclomine (BENTYL) 20 MG tablet      Take one every 6-8 hours for abdominal cramps   20 tablet   0   . ondansetron (ZOFRAN ODT) 4 MG disintegrating tablet      4mg  ODT q4-6 hours prn nausea/vomit   12 tablet   0    BP 116/77  Pulse 136  Temp(Src) 98.3 F (36.8 C) (Oral)  Resp 16  Ht 5\' 5"  (1.651 m)  Wt 260 lb (117.935 kg)  BMI 43.27 kg/m2  SpO2 99% Physical Exam  Constitutional: She is oriented to person, place, and time. She appears well-developed.  HENT:  Head: Normocephalic.  Eyes: Conjunctivae and EOM are normal. No scleral icterus.  Neck: Neck supple. No thyromegaly present.  Cardiovascular: Normal rate and regular rhythm.  Exam reveals no gallop and no friction rub.   No murmur heard. Pulmonary/Chest: No stridor. She has no wheezes. She has no rales. She exhibits no tenderness.  Abdominal: She exhibits no distension. There is tenderness. There is no rebound.  Mild diffuse tenderness  Musculoskeletal: Normal range of motion. She exhibits no edema.  Lymphadenopathy:    She has no  cervical adenopathy.  Neurological: She is oriented to person, place, and time. She exhibits normal muscle tone. Coordination normal.  Skin: No rash noted. No erythema.  Psychiatric: She has a normal mood and affect. Her behavior is normal.    ED Course  Procedures (including critical care time) Labs Review Labs Reviewed  CBC WITH DIFFERENTIAL - Abnormal; Notable for the following:    WBC 11.9 (*)    Neutrophils Relative % 92 (*)    Neutro Abs 11.0 (*)    Lymphocytes Relative 5 (*)    Lymphs Abs 0.5 (*)    All other components within normal limits  COMPREHENSIVE METABOLIC PANEL - Abnormal;  Notable for the following:    Glucose, Bld 105 (*)    Total Bilirubin 1.3 (*)    All other components within normal limits   Imaging Review No results found.  EKG Interpretation   None       MDM   Final diagnoses:  Gastroenteritis       Benny LennertJoseph L Delisia Mcquiston, MD 03/27/13 2106

## 2013-12-01 ENCOUNTER — Encounter (HOSPITAL_COMMUNITY): Payer: Self-pay | Admitting: Emergency Medicine

## 2014-08-09 ENCOUNTER — Emergency Department (HOSPITAL_COMMUNITY)
Admission: EM | Admit: 2014-08-09 | Discharge: 2014-08-09 | Disposition: A | Discharge status: Home / Self Care | Payer: Self-pay | Attending: Emergency Medicine | Admitting: Emergency Medicine

## 2014-08-09 ENCOUNTER — Emergency Department (HOSPITAL_COMMUNITY): Payer: Medicaid Other

## 2014-08-09 ENCOUNTER — Encounter (HOSPITAL_COMMUNITY): Payer: Self-pay | Admitting: Emergency Medicine

## 2014-08-09 DIAGNOSIS — N12 Tubulo-interstitial nephritis, not specified as acute or chronic: Secondary | ICD-10-CM | POA: Insufficient documentation

## 2014-08-09 DIAGNOSIS — Z3202 Encounter for pregnancy test, result negative: Secondary | ICD-10-CM | POA: Insufficient documentation

## 2014-08-09 DIAGNOSIS — R109 Unspecified abdominal pain: Secondary | ICD-10-CM

## 2014-08-09 DIAGNOSIS — Z9889 Other specified postprocedural states: Secondary | ICD-10-CM | POA: Insufficient documentation

## 2014-08-09 LAB — POC URINE PREG, ED: Preg Test, Ur: NEGATIVE

## 2014-08-09 LAB — URINE MICROSCOPIC-ADD ON

## 2014-08-09 LAB — URINALYSIS, ROUTINE W REFLEX MICROSCOPIC
BILIRUBIN URINE: NEGATIVE
Glucose, UA: NEGATIVE mg/dL
KETONES UR: NEGATIVE mg/dL
NITRITE: NEGATIVE
PROTEIN: 30 mg/dL — AB
Specific Gravity, Urine: 1.02 (ref 1.005–1.030)
UROBILINOGEN UA: 0.2 mg/dL (ref 0.0–1.0)
pH: 6 (ref 5.0–8.0)

## 2014-08-09 MED ORDER — IBUPROFEN 800 MG PO TABS
800.0000 mg | ORAL_TABLET | Freq: Once | ORAL | Status: AC
  Administered 2014-08-09: 800 mg via ORAL
  Filled 2014-08-09: qty 1

## 2014-08-09 MED ORDER — HYDROCODONE-ACETAMINOPHEN 5-325 MG PO TABS: ORAL_TABLET | ORAL | Status: DC

## 2014-08-09 MED ORDER — CEPHALEXIN 500 MG PO CAPS: 500.0000 mg | ORAL_CAPSULE | Freq: Four times a day (QID) | ORAL | Status: DC

## 2014-08-09 MED ORDER — PHENAZOPYRIDINE HCL 100 MG PO TABS
200.0000 mg | ORAL_TABLET | Freq: Once | ORAL | Status: AC
  Administered 2014-08-09: 200 mg via ORAL
  Filled 2014-08-09: qty 2

## 2014-08-09 MED ORDER — CEPHALEXIN 500 MG PO CAPS
500.0000 mg | ORAL_CAPSULE | Freq: Once | ORAL | Status: AC
  Administered 2014-08-09: 500 mg via ORAL
  Filled 2014-08-09: qty 1

## 2014-08-09 MED ORDER — PHENAZOPYRIDINE HCL 200 MG PO TABS: 200.0000 mg | ORAL_TABLET | Freq: Three times a day (TID) | ORAL | Status: DC

## 2014-08-09 NOTE — ED Notes (Signed)
Patient complaining of pain to right lower back radiating through to right side. Also reports cloudy urine. Denies dysuria, odor to urine, or hematuria.

## 2014-08-09 NOTE — ED Notes (Signed)
POC urine pregnancy was negative. 

## 2014-08-09 NOTE — Discharge Instructions (Signed)
Pyelonephritis, Adult °Pyelonephritis is a kidney infection. A kidney infection can happen quickly, or it can last for a long time. °HOME CARE  °· Take your medicine (antibiotics) as told. Finish it even if you start to feel better. °· Keep all doctor visits as told. °· Drink enough fluids to keep your pee (urine) clear or pale yellow. °· Only take medicine as told by your doctor. °GET HELP RIGHT AWAY IF:  °· You have a fever or lasting symptoms for more than 2-3 days. °· You have a fever and your symptoms suddenly get worse. °· You cannot take your medicine or drink fluids as told. °· You have chills and shaking. °· You feel very weak or pass out (faint). °· You do not feel better after 2 days. °MAKE SURE YOU: °· Understand these instructions. °· Will watch your condition. °· Will get help right away if you are not doing well or get worse. °Document Released: 02/24/2004 Document Revised: 07/18/2011 Document Reviewed: 07/06/2010 °ExitCare® Patient Information ©2015 [employer]. This information is not intended to replace advice given to you by your health care provider. Make sure you discuss any questions you have with your health care provider. ° °

## 2014-08-12 LAB — URINE CULTURE

## 2014-08-12 NOTE — ED Provider Notes (Signed)
CSN: [id number]     Arrival date & time 08/09/14  1935 History   First MD Initiated Contact with Patient 08/09/14 2040     Chief Complaint  Patient presents with  . Flank Pain     (Consider location/radiation/quality/duration/timing/severity/associated sxs/prior Treatment) HPI   Nancy Russell is a 24 y.o. female who presents to the Emergency Department complaining of right sided flank and back pain for nearly one week.  She describes the pain as dull and radiating toward her lower right abdomen.  She also reports nausea without vomiting.  She also denies fever, vaginal bleeding or d/c, urine changes or bloody urine.  She has tried OTC analgesics without relief.   Past Medical History  Diagnosis Date  . HQIONGEX(528.4)    Past Surgical History  Procedure Laterality Date  . Cystectomy  at age 76    cyst removed from my chest   . Cesarean section  01/05/2012    Procedure: CESAREAN SECTION;  Surgeon: Lazaro Arms, MD;  Location: [facility];  Service: Obstetrics;  Laterality: N/A;   Family History  Problem Relation Age of Onset  . Anesthesia problems Neg Hx   . Hypotension Neg Hx   . Malignant hyperthermia Neg Hx   . Pseudochol deficiency Neg Hx   . Other Neg Hx    History  Substance Use Topics  . Smoking status: Never Smoker   . Smokeless tobacco: Never Used  . Alcohol Use: No   OB History    Gravida Para Term Preterm AB TAB SAB Ectopic Multiple Living       Review of Systems  Constitutional: Negative for fever, activity change and appetite change.  HENT: Negative for sore throat and trouble swallowing.   Respiratory: Negative for chest tightness and shortness of breath.   Cardiovascular: Negative for chest pain.  Gastrointestinal: Positive for nausea. Negative for vomiting, abdominal pain and diarrhea.  Genitourinary: Negative for dysuria, frequency, decreased urine volume, vaginal bleeding, vaginal discharge, difficulty urinating and vaginal pain.   Musculoskeletal: Positive for back pain.  Skin: Negative for rash.  Neurological: Negative for dizziness, weakness and numbness.  All other systems reviewed and are negative.     Allergies  Review of patient's allergies indicates no known allergies.  Home Medications   Prior to Admission medications   Medication Sig Start Date End Date Taking? Authorizing Provider  etonogestrel (IMPLANON) 68 MG IMPL implant 1 each by Subdermal route once.   Yes Historical Provider, MD  cephALEXin (KEFLEX) 500 MG capsule Take 1 capsule (500 mg total) by mouth 4 (four) times daily. For 10 days 08/09/14   Pauline Aus, PA-C  HYDROcodone-acetaminophen (NORCO/VICODIN) 5-325 MG per tablet Take one-two tabs po q 4-6 hrs prn pain 08/09/14   Everlee Quakenbush, PA-C  phenazopyridine (PYRIDIUM) 200 MG tablet Take 1 tablet (200 mg total) by mouth 3 (three) times daily. 08/09/14   Ranae Casebier, PA-C   BP 126/82 mmHg  Pulse 82  Temp(Src) 98.7 F (37.1 C) (Oral)  Resp 20  Ht  (1.651 m)  Wt 260 lb (117.935 kg)  BMI 43.27 kg/m2  SpO2 99%  LMP  Physical Exam  Constitutional: She is oriented to person, place, and time. She appears well-developed and well-nourished. No distress.  HENT:  Head: Normocephalic and atraumatic.  Cardiovascular: Normal rate, regular rhythm, normal heart sounds and intact distal pulses.   No murmur heard. Pulmonary/Chest: Effort normal and breath sounds normal. No  respiratory distress. She has no wheezes. She has no rales.  Abdominal: Soft. Normal appearance. She exhibits no distension and no mass. There is no hepatosplenomegaly. There is no tenderness. There is CVA tenderness. There is no rigidity, no rebound, no guarding and no tenderness at McBurney's point.  Mild right CVA tenderness on exam.    Musculoskeletal: Normal range of motion. She exhibits no edema.  Neurological: She is alert and oriented to person, place, and time. Coordination normal.  Skin: Skin is warm and dry. No  rash noted.  Nursing note and vitals reviewed.   ED Course  Procedures (including critical care time) Labs Review Labs Reviewed  URINALYSIS, ROUTINE W REFLEX MICROSCOPIC (NOT AT [facility]) - Abnormal; Notable for the following:    APPearance HAZY (*)    Hgb urine dipstick LARGE (*)    Protein, ur 30 (*)    Leukocytes, UA SMALL (*)    All other components within normal limits  URINE MICROSCOPIC-ADD ON - Abnormal; Notable for the following:    Squamous Epithelial / LPF MANY (*)    Bacteria, UA MANY (*)    All other components within normal limits  URINE CULTURE  POC URINE PREG, ED    Imaging Review Ct Renal Stone Study  08/09/2014   CLINICAL DATA:  24 year old female with right lower back pain  EXAM: CT ABDOMEN AND PELVIS WITHOUT CONTRAST  TECHNIQUE: Multidetector CT imaging of the abdomen and pelvis was performed following the standard protocol without IV contrast.  COMPARISON:  None.  FINDINGS: Evaluation of this exam is limited in the absence of intravenous contrast.  The visualized lung bases are clear. No intra-abdominal free air or free fluid.  Fatty liver. The gallbladder, pancreas, spleen, adrenal glands, and kidneys appear unremarkable. The urinary bladder is collapsed. The uterus is anteverted appears grossly unremarkable.  no evidence of bowel obstruction or inflammation.  Normal appendix.  There is mild haziness of the mesentery with multiple top-normal lymph nodes with a "misty mesentery" appearance. This finding is nonspecific but may be related to underlying inflammatory/infectious etiology. Bilateral L5 pars defects. No acute fracture.  IMPRESSION: No hydronephrosis or nephrolithiasis.  No evidence of bowel obstruction or inflammation.  Normal appendix.   Electronically Signed   By: Elgie CollardArash  Radparvar M.D.   On: 08/09/2014 22:19     EKG Interpretation None      MDM   Final diagnoses:  Flank pain  Pyelonephritis    Pt is well appearing.  No fever, chills, vomiting or  abdominal pain.  Mild CVA tenderness and persistent sx's likely related to pyelonephritis.    Urine culture pending  Vital stable and pt appears stable for d/c.  CT neg for stone.  Will rx keflex, pyridium and pain medication.  She agrees to increase fluids and close f/u with PMD.  Also advised to return for any worsening sx's    Pauline Ausammy Lang Zingg, PA-C 08/12/14 1306  Glynn OctaveStephen Rancour, MD 08/13/14 [phone]

## 2014-08-13 MED FILL — Hydrocodone-Acetaminophen Tab 5-325 MG: ORAL | Qty: 6 | Status: AC

## 2015-02-12 ENCOUNTER — Other Ambulatory Visit (HOSPITAL_COMMUNITY)
Admission: RE | Admit: 2015-02-12 | Discharge: 2015-02-12 | Disposition: A | Discharge status: Home / Self Care | Payer: BLUE CROSS/BLUE SHIELD | Source: Ambulatory Visit | Attending: Adult Health | Admitting: Adult Health

## 2015-02-12 ENCOUNTER — Encounter: Payer: Self-pay | Admitting: Adult Health

## 2015-02-12 ENCOUNTER — Ambulatory Visit (INDEPENDENT_AMBULATORY_CARE_PROVIDER_SITE_OTHER): Payer: BLUE CROSS/BLUE SHIELD | Admitting: Adult Health

## 2015-02-12 VITALS — BP 124/64 | HR 98 | Ht 65.25 in | Wt 298.0 lb

## 2015-02-12 DIAGNOSIS — Z01419 Encounter for gynecological examination (general) (routine) without abnormal findings: Secondary | ICD-10-CM | POA: Diagnosis not present

## 2015-02-12 DIAGNOSIS — Z113 Encounter for screening for infections with a predominantly sexual mode of transmission: Secondary | ICD-10-CM | POA: Insufficient documentation

## 2015-02-12 DIAGNOSIS — Z975 Presence of (intrauterine) contraceptive device: Secondary | ICD-10-CM

## 2015-02-12 HISTORY — DX: Presence of (intrauterine) contraceptive device: Z97.5

## 2015-02-12 NOTE — Patient Instructions (Signed)
Physical in 1 year, pap in 3 if normal Use condoms Return in 1/24 for nexplanon removal and re insertion

## 2015-02-12 NOTE — Progress Notes (Signed)
Patient ID: Nancy Russell, female   DOB: 04-Nov-1990, 25 y.o.   MRN: [id number] History of Present Illness: Nancy Russell is a 25 year old white female, engaged, G1P2, in for a well woman gyn exam and pap, she has nexplanon and it is due to be removed, it was inserted 02/23/12.   Current Medications, Allergies, Past Medical History, Past Surgical History, Family History and Social History were reviewed in [facility] electronic medical record.     Review of Systems:  Patient denies any headaches, hearing loss, fatigue, blurred vision, shortness of breath, chest pain, abdominal pain, problems with bowel movements, urination, or intercourse. No joint pain or mood swings.   Physical Exam:BP 124/64 mmHg  Pulse 98  Ht 5' 5.25" (1.657 m)  Wt 298 lb (135.172 kg)  BMI 49.23 kg/m2 General:  Well developed, well nourished, no acute distress Skin:  Warm and dry Neck:  Midline trachea, normal thyroid, good ROM, no lymphadenopathy Lungs; Clear to auscultation bilaterally Breast:  No dominant palpable mass, retraction, or nipple discharge Cardiovascular: Regular rate and rhythm Abdomen:  Soft, non tender, no hepatosplenomegaly,obese  Pelvic:  External genitalia is normal in appearance, no lesions.  The vagina is normal in appearance. Urethra has no lesions or masses. The cervix is smooth, pap with GC/CHl performed.  Uterus is felt to be normal size, shape, and contour.  No adnexal masses or tenderness noted.Bladder is non tender, no masses felt. Extremities/musculoskeletal:  No swelling or varicosities noted, no clubbing or cyanosis, nexplanon palpated left arm Psych:  No mood changes, alert and cooperative,seems happy   Impression: Well woman gyn exam with pap nexplanon in place    Plan: Use condoms Order nexplanon Return 1/24 for nexplanon removal and reinsertion Physical in 1 year, pap in 3 if normal

## 2015-02-18 LAB — CYTOLOGY - PAP

## 2015-02-22 ENCOUNTER — Ambulatory Visit: Payer: BLUE CROSS/BLUE SHIELD | Admitting: Adult Health

## 2015-02-23 ENCOUNTER — Encounter: Payer: BLUE CROSS/BLUE SHIELD | Admitting: Adult Health

## 2015-02-26 ENCOUNTER — Encounter: Payer: Self-pay | Admitting: Adult Health

## 2015-02-26 ENCOUNTER — Ambulatory Visit (INDEPENDENT_AMBULATORY_CARE_PROVIDER_SITE_OTHER): Payer: BLUE CROSS/BLUE SHIELD | Admitting: Adult Health

## 2015-02-26 VITALS — BP 102/62 | HR 92 | Ht 65.0 in | Wt 301.0 lb

## 2015-02-26 DIAGNOSIS — Z3046 Encounter for surveillance of implantable subdermal contraceptive: Secondary | ICD-10-CM | POA: Diagnosis not present

## 2015-02-26 HISTORY — DX: Encounter for surveillance of implantable subdermal contraceptive: Z30.46

## 2015-02-26 NOTE — Patient Instructions (Signed)
Use condoms, or no sex, keep clean and dry x 24 hours, no heavy lifting, keep steri strips on x 72 hours, Keep pressure dressing on x 24 hours. Follow up prn problems. Review handout on IUD

## 2015-02-26 NOTE — Progress Notes (Signed)
Subjective:     Patient ID: Nancy Russell, female   DOB: Nov 19, 1990, 25 y.o.   MRN: [id number]  HPI Nancy Russell is a 25 year old white female in for nexplanon removal, she is trying to get approved for sleeve surgery,doctor does not want her having hormonal birth control, so will not put nexplanon in today. She can have para gard IUD or use condoms.   Review of Systems Patient denies any headaches, hearing loss, fatigue, blurred vision, shortness of breath, chest pain, abdominal pain, problems with bowel movements, urination, or intercourse. No joint pain or mood swings.For nexplanon removal Reviewed past medical,surgical, social and family history. Reviewed medications and allergies.     Objective:   Physical Exam BP 102/62 mmHg  Pulse 92  Ht  (1.651 m)  Wt 301 lb (136.533 kg)  BMI 50.09 kg/m2Consent signed, time out called.   Left arm cleansed with betadine, and injected with 1.5 cc 2% lidocaine and waited til numb.Under sterile technique a #11 blade was used to make small vertical incision, and a curved forceps was used to easily remove rod. Steri strips applied. Pressure dressing applied. Assessment:     Nexplanon removal     Plan:     Use condoms, keep clean and dry x 24 hours, no heavy lifting, keep steri strips on x 72 hours, Keep pressure dressing on x 24 hours. Follow up prn problems.   No sex for now, review handout on IUD and let me know decision.

## 2015-03-12 ENCOUNTER — Emergency Department (HOSPITAL_COMMUNITY)
Admission: EM | Admit: 2015-03-12 | Discharge: 2015-03-12 | Disposition: A | Discharge status: Home / Self Care | Payer: BLUE CROSS/BLUE SHIELD | Attending: Emergency Medicine | Admitting: Emergency Medicine

## 2015-03-12 ENCOUNTER — Emergency Department (HOSPITAL_COMMUNITY): Payer: BLUE CROSS/BLUE SHIELD

## 2015-03-12 ENCOUNTER — Encounter (HOSPITAL_COMMUNITY): Payer: Self-pay | Admitting: *Deleted

## 2015-03-12 DIAGNOSIS — E669 Obesity, unspecified: Secondary | ICD-10-CM | POA: Diagnosis not present

## 2015-03-12 DIAGNOSIS — J069 Acute upper respiratory infection, unspecified: Secondary | ICD-10-CM | POA: Diagnosis not present

## 2015-03-12 DIAGNOSIS — R05 Cough: Secondary | ICD-10-CM | POA: Diagnosis present

## 2015-03-12 DIAGNOSIS — B9789 Other viral agents as the cause of diseases classified elsewhere: Secondary | ICD-10-CM

## 2015-03-12 MED ORDER — FEXOFENADINE-PSEUDOEPHED ER 60-120 MG PO TB12: 1.0000 | ORAL_TABLET | Freq: Two times a day (BID) | ORAL | Status: DC | PRN

## 2015-03-12 MED ORDER — GUAIFENESIN-CODEINE 100-10 MG/5ML PO SYRP: 5.0000 mL | ORAL_SOLUTION | Freq: Three times a day (TID) | ORAL | Status: DC | PRN

## 2015-03-12 NOTE — Discharge Instructions (Signed)
Viral Infections °A viral infection can be caused by different types of viruses. Most viral infections are not serious and resolve on their own. However, some infections may cause severe symptoms and may lead to further complications. °SYMPTOMS °Viruses can frequently cause: °· Minor sore throat. °· Aches and pains. °· Headaches. °· Runny nose. °· Different types of rashes. °· Watery eyes. °· Tiredness. °· Cough. °· Loss of appetite. °· Gastrointestinal infections, resulting in nausea, vomiting, and diarrhea. °These symptoms do not respond to antibiotics because the infection is not caused by bacteria. However, you might catch a bacterial infection following the viral infection. This is sometimes called a "superinfection." Symptoms of such a bacterial infection may include: °· Worsening sore throat with pus and difficulty swallowing. °· Swollen neck glands. °· Chills and a high or persistent fever. °· Severe headache. °· Tenderness over the sinuses. °· Persistent overall ill feeling (malaise), muscle aches, and tiredness (fatigue). °· Persistent cough. °· Yellow, green, or brown mucus production with coughing. °HOME CARE INSTRUCTIONS  °· Only take over-the-counter or prescription medicines for pain, discomfort, diarrhea, or fever as directed by your caregiver. °· Drink enough water and fluids to keep your urine clear or pale yellow. Sports drinks can provide valuable electrolytes, sugars, and hydration. °· Get plenty of rest and maintain proper nutrition. Soups and broths with crackers or rice are fine. °SEEK IMMEDIATE MEDICAL CARE IF:  °· You have severe headaches, shortness of breath, chest pain, neck pain, or an unusual rash. °· You have uncontrolled vomiting, diarrhea, or you are unable to keep down fluids. °· You or your child has an oral temperature above 102° F (38.9° C), not controlled by medicine. °· Your baby is older than 3 months with a rectal temperature of 102° F (38.9° C) or higher. °· Your baby is 3  months old or younger with a rectal temperature of 100.4° F (38° C) or higher. °MAKE SURE YOU:  °· Understand these instructions. °· Will watch your condition. °· Will get help right away if you are not doing well or get worse. °  °This information is not intended to replace advice given to you by your health care provider. Make sure you discuss any questions you have with your health care provider. °  °Document Released: 10/26/2004 Document Revised: 04/10/2011 Document Reviewed: 06/24/2014 °Elsevier Interactive Patient Education ©2016 [employer] ° °

## 2015-03-12 NOTE — ED Notes (Signed)
Pt c/o cough that is non productive, worse at night, chest congestion, chills that started a few days ago and has progressively gotten worse,

## 2015-03-15 HISTORY — PX: ESOPHAGOGASTRODUODENOSCOPY: SHX1529

## 2015-03-20 NOTE — ED Provider Notes (Signed)
CSN: [id number]     Arrival date & time 03/12/15  6578 History   First MD Initiated Contact with Patient 03/12/15 [phone]     Chief Complaint  Patient presents with  . Cough     (Consider location/radiation/quality/duration/timing/severity/associated sxs/prior Treatment) HPI   25 year old female with cough and congestion. Symptom onset approximately 3 days ago. Gradual onset of progressively worsening. Persistent, nonproductive cough. Worse at night when laying down. Feels congested. Facial pressure. Chills. No reported fever. Mild nausea. No vomiting. Denies any significant pain. No unusual leg swelling. No sick contacts.  Past Medical History  Diagnosis Date  . Headache(784.0)   . Vaginal Pap smear, abnormal   . Nexplanon in place 02/12/2015  . Nexplanon removal 02/26/2015   Past Surgical History  Procedure Laterality Date  . Cystectomy  at age 1    cyst removed from my chest   . Cesarean section  01/05/2012    Procedure: CESAREAN SECTION;  Surgeon: Lazaro Arms, MD;  Location: [facility];  Service: Obstetrics;  Laterality: N/A;   Family History  Problem Relation Age of Onset  . Anesthesia problems Neg Hx   . Hypotension Neg Hx   . Malignant hyperthermia Neg Hx   . Pseudochol deficiency Neg Hx   . Other Neg Hx    Social History  Substance Use Topics  . Smoking status: Never Smoker   . Smokeless tobacco: Never Used  . Alcohol Use: No   OB History    Gravida Para Term Preterm AB TAB SAB Ectopic Multiple Living       Review of Systems  All systems reviewed and negative, other than as noted in HPI.   Allergies  Review of patient's allergies indicates no known allergies.  Home Medications   Prior to Admission medications   Medication Sig Start Date End Date Taking? Authorizing Provider  fexofenadine-pseudoephedrine (ALLEGRA-D) 60-120 MG 12 hr tablet Take 1 tablet by mouth 2 (two) times daily as needed (congestion). 03/12/15   Raeford Razor, MD   guaiFENesin-codeine [facility]) 100-10 MG/5ML syrup Take 5 mLs by mouth 3 (three) times daily as needed for cough. 03/12/15   Raeford Razor, MD   BP 125/75 mmHg  Pulse 75  Temp(Src) 97.8 F (36.6 C) (Oral)  Resp 20  Ht  (1.651 m)  Wt 301 lb (136.533 kg)  BMI 50.09 kg/m2  SpO2 99% Physical Exam  Constitutional: She appears well-developed and well-nourished. No distress.  Sitting in bed. No acute distress. Obese.  HENT:  Head: Normocephalic and atraumatic.  Eyes: Conjunctivae are normal. Right eye exhibits no discharge. Left eye exhibits no discharge.  Neck: Neck supple.  Cardiovascular: Normal rate, regular rhythm and normal heart sounds.  Exam reveals no gallop and no friction rub.   No murmur heard. Pulmonary/Chest: Effort normal and breath sounds normal. No respiratory distress.  Abdominal: Soft. She exhibits no distension. There is no tenderness.  Musculoskeletal: She exhibits no edema or tenderness.  Exam is by body habitus, but lower extremities appear symmetric as compared to each other. No appreciable edema. No calf tenderness.  Neurological: She is alert.  Skin: Skin is warm and dry.  Psychiatric: She has a normal mood and affect. Her behavior is normal. Thought content normal.  Nursing note and vitals reviewed.   ED Course  Procedures (including critical care time) Labs Review Labs Reviewed - No data to display  Imaging Review No results found. I have personally  reviewed and evaluated these images and lab results as part of my medical decision-making.   EKG Interpretation None      MDM   Final diagnoses:  Viral URI with cough    24y female but I suspect is viral URI. Afebrile. He dynamically stable. No increased work of breathing. Chest x-ray without acute abnormality. Symptomatic treatment. Low suspicion for emergent process. Return precautions were discussed.    Raeford Razor, MD 03/20/15 [phone]

## 2015-06-12 ENCOUNTER — Emergency Department (HOSPITAL_COMMUNITY): Payer: BLUE CROSS/BLUE SHIELD

## 2015-06-12 ENCOUNTER — Observation Stay (HOSPITAL_COMMUNITY)
Admission: EM | Admit: 2015-06-12 | Discharge: 2015-06-13 | Disposition: A | Discharge status: Home / Self Care | Payer: BLUE CROSS/BLUE SHIELD | Attending: Family Medicine | Admitting: Family Medicine

## 2015-06-12 ENCOUNTER — Encounter (HOSPITAL_COMMUNITY): Payer: Self-pay | Admitting: *Deleted

## 2015-06-12 DIAGNOSIS — D735 Infarction of spleen: Principal | ICD-10-CM | POA: Diagnosis present

## 2015-06-12 DIAGNOSIS — R17 Unspecified jaundice: Secondary | ICD-10-CM | POA: Insufficient documentation

## 2015-06-12 DIAGNOSIS — R945 Abnormal results of liver function studies: Secondary | ICD-10-CM

## 2015-06-12 DIAGNOSIS — Z79899 Other long term (current) drug therapy: Secondary | ICD-10-CM | POA: Diagnosis not present

## 2015-06-12 DIAGNOSIS — E871 Hypo-osmolality and hyponatremia: Secondary | ICD-10-CM

## 2015-06-12 DIAGNOSIS — R112 Nausea with vomiting, unspecified: Secondary | ICD-10-CM | POA: Diagnosis present

## 2015-06-12 DIAGNOSIS — R42 Dizziness and giddiness: Secondary | ICD-10-CM | POA: Insufficient documentation

## 2015-06-12 DIAGNOSIS — E872 Acidosis, unspecified: Secondary | ICD-10-CM

## 2015-06-12 DIAGNOSIS — R7401 Elevation of levels of liver transaminase levels: Secondary | ICD-10-CM

## 2015-06-12 DIAGNOSIS — E876 Hypokalemia: Secondary | ICD-10-CM

## 2015-06-12 DIAGNOSIS — R740 Nonspecific elevation of levels of transaminase and lactic acid dehydrogenase [LDH]: Secondary | ICD-10-CM | POA: Insufficient documentation

## 2015-06-12 HISTORY — DX: Acquired absence of stomach (part of): Z90.3

## 2015-06-12 LAB — CBC WITH DIFFERENTIAL/PLATELET
Basophils Absolute: 0 10*3/uL (ref 0.0–0.1)
Basophils Relative: 0 %
EOS PCT: 0 %
Eosinophils Absolute: 0 10*3/uL (ref 0.0–0.7)
HCT: 42.7 % (ref 36.0–46.0)
Hemoglobin: 14.7 g/dL (ref 12.0–15.0)
LYMPHS PCT: 24 %
Lymphs Abs: 2.4 10*3/uL (ref 0.7–4.0)
MCH: 30.6 pg (ref 26.0–34.0)
MCHC: 34.4 g/dL (ref 30.0–36.0)
MCV: 88.8 fL (ref 78.0–100.0)
MONO ABS: 0.6 10*3/uL (ref 0.1–1.0)
MONOS PCT: 6 %
Neutro Abs: 6.8 10*3/uL (ref 1.7–7.7)
Neutrophils Relative %: 70 %
PLATELETS: 300 10*3/uL (ref 150–400)
RBC: 4.81 MIL/uL (ref 3.87–5.11)
RDW: 12.6 % (ref 11.5–15.5)
WBC: 9.8 10*3/uL (ref 4.0–10.5)

## 2015-06-12 LAB — COMPREHENSIVE METABOLIC PANEL
ALT: 100 U/L — AB (ref 14–54)
AST: 71 U/L — AB (ref 15–41)
Albumin: 4.8 g/dL (ref 3.5–5.0)
Alkaline Phosphatase: 63 U/L (ref 38–126)
Anion gap: 15 (ref 5–15)
BUN: 6 mg/dL (ref 6–20)
CHLORIDE: 100 mmol/L — AB (ref 101–111)
CO2: 18 mmol/L — AB (ref 22–32)
CREATININE: 0.78 mg/dL (ref 0.44–1.00)
Calcium: 9.4 mg/dL (ref 8.9–10.3)
GFR calc Af Amer: >60 mL/min (ref >60)
GFR calc non Af Amer: >60 mL/min (ref >60)
Glucose, Bld: 95 mg/dL (ref 65–99)
Potassium: 3.7 mmol/L (ref 3.5–5.1)
Sodium: 133 mmol/L — ABNORMAL LOW (ref 135–145)
Total Bilirubin: 2 mg/dL — ABNORMAL HIGH (ref 0.3–1.2)
Total Protein: 8.2 g/dL — ABNORMAL HIGH (ref 6.5–8.1)

## 2015-06-12 LAB — PREGNANCY, URINE: Preg Test, Ur: NEGATIVE

## 2015-06-12 LAB — URINE MICROSCOPIC-ADD ON: WBC, UA: NONE SEEN WBC/hpf (ref 0–5)

## 2015-06-12 LAB — URINALYSIS, ROUTINE W REFLEX MICROSCOPIC
Glucose, UA: NEGATIVE mg/dL
HGB URINE DIPSTICK: NEGATIVE
Ketones, ur: >80 mg/dL — AB
Leukocytes, UA: NEGATIVE
Nitrite: NEGATIVE
Protein, ur: 30 mg/dL — AB
pH: 6 (ref 5.0–8.0)

## 2015-06-12 LAB — LIPASE, BLOOD: Lipase: 55 U/L — ABNORMAL HIGH (ref 11–51)

## 2015-06-12 LAB — LACTIC ACID, PLASMA: Lactic Acid, Venous: 1.1 mmol/L (ref 0.5–2.0)

## 2015-06-12 MED ORDER — SODIUM CHLORIDE 0.9 % IV BOLUS (SEPSIS)
500.0000 mL | Freq: Once | INTRAVENOUS | Status: AC
  Administered 2015-06-12: 500 mL via INTRAVENOUS

## 2015-06-12 MED ORDER — DIATRIZOATE MEGLUMINE & SODIUM 66-10 % PO SOLN
15.0000 mL | Freq: Once | ORAL | Status: AC
  Administered 2015-06-13: 15 mL via ORAL
  Filled 2015-06-12: qty 30

## 2015-06-12 MED ORDER — ONDANSETRON HCL 4 MG/2ML IJ SOLN
4.0000 mg | Freq: Once | INTRAMUSCULAR | Status: AC
  Administered 2015-06-12: 4 mg via INTRAVENOUS
  Filled 2015-06-12: qty 2

## 2015-06-12 MED ORDER — SODIUM CHLORIDE 0.9 % IV BOLUS (SEPSIS): 1000.0000 mL | Freq: Once | INTRAVENOUS | Status: DC

## 2015-06-12 NOTE — ED Provider Notes (Signed)
The patient is a morbidly obese 25 year old female who is approximately 2-1/2 weeks status post gastric sleeve surgery.  She presents today after having multiple episodes of nausea and vomiting, she has been on a clear liquid diet for the last 2-1/2 weeks, she has not yet advanced to solid foods, she reports having a small amount of hard stool but is not passing gas. She has been able to pass urine without difficulty and denies any abdominal pain. Reportedly the patient had been started on Keflex approximately one week ago after developing some purulent discharge from one of her surgical wounds but she states that that is totally resolved  On exam the patient is obese, she has a soft obese abdomen without tenderness or guarding, all of her surgical incision port sites are clear, minimal scabbing, no redness, no tenderness, no drainage. She has a quiet abdomen but no tympanitic sounds to percussion, her heart and lung sounds are clear, she does not appear to be in distress.  She will need IV fluids, labs, acute abdominal series, if no acute findings are found to explain the patient's symptoms she will likely need a CT scan to further evaluate for obstruction or ileus. The patient is in agreement with the workup. Fluids, antiemetics  Medical screening examination/treatment/procedure(s) were conducted as a shared visit with non-physician practitioner(s) and myself.  I personally evaluated the patient during the encounter.  Clinical Impression:   Final diagnoses:  Infarction of spleen  Elevated transaminase level  Elevated bilirubin  Non-intractable vomiting with nausea, unspecified vomiting type  Metabolic acidosis    Meds given in ED:  Medications  sodium chloride 0.9 % bolus 500 mL (500 mLs Intravenous New Bag/Given 06/12/15 2226)  ondansetron (ZOFRAN) injection 4 mg (4 mg Intravenous Given 06/12/15 2227)  diatrizoate meglumine-sodium (GASTROGRAFIN) 66-10 % solution 15 mL (15 mLs Oral Given  06/13/15 0138)  ondansetron (ZOFRAN) injection 4 mg (4 mg Intravenous Given 06/13/15 0034)  iopamidol (ISOVUE-300) 61 % injection 100 mL (100 mLs Intravenous Contrast Given 06/13/15 0137)  sodium chloride 0.9 % bolus 500 mL (500 mLs Intravenous Given 06/13/15 1358)      Eber HongBrian Shacara Cozine, MD 06/16/15 [phone]

## 2015-06-12 NOTE — ED Provider Notes (Signed)
CSN: [id number]     Arrival date & time 06/12/15  2129 History   First MD Initiated Contact with Patient 06/12/15 2142     Chief Complaint  Patient presents with  . Emesis     (Consider location/radiation/quality/duration/timing/severity/associated sxs/prior Treatment) HPI Comments: Nancy Russell is a 25 y.o. female s/p gastric sleeve on 05/27/15 presents to ED with complaint of generalized fatigue, dizziness, nausea, and vomiting. Patient reports unremarkable post-op period until a week ago when she noted pus draining from two incision sites. She was started on Keflex BID. Today, she reports multiple episodes of emesis, nausea, generalized fatigue, dizziness, and light headedness.  No loss of consciousness. She denies fever, chills, night sweats. She had a small bowel movement today, noted as "pebbles" in character. Denies blood in stool. Patient denies passing flatus. Denies urinary symptoms. She tried taking an anti-nausea medication this morning; however, reports vomiting the medication back up. Of note, patient has been on a liquid diet since the surgery and continues to be on liquid diet until follow up on Tuesday.   Patient is a 25 y.o. female presenting with vomiting. The history is provided by the patient.  Emesis Duration:  1 day   Past Medical History  Diagnosis Date  . Headache(784.0)   . Vaginal Pap smear, abnormal   . Nexplanon in place 02/12/2015  . Nexplanon removal 02/26/2015  . 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 Cystectomy  at age 25    cyst removed from my chest   . Cesarean section  01/05/2012    Procedure: CESAREAN SECTION;  Surgeon: Lazaro ArmsLuther H Eure, MD;  Location: [facility];  Service: Obstetrics;  Laterality: N/A;  . Abdominal surgery      gastrectomy   Family History  Problem Relation Age of Onset  . Anesthesia problems Neg Hx   . Hypotension Neg Hx   . Malignant hyperthermia Neg Hx   .  Pseudochol deficiency Neg Hx   . Other Neg Hx    Social History  Substance Use Topics  . Smoking status: Never Smoker   . Smokeless tobacco: Never Used  . Alcohol Use: No   OB History    Gravida Para Term Preterm AB TAB SAB Ectopic Multiple Living   1 1 0 1 0 0 0 0 1 2      Review of Systems  Constitutional: Positive for fatigue.  Gastrointestinal: Positive for nausea and vomiting.  Neurological: Positive for dizziness and light-headedness.  All other systems reviewed and are negative.     Allergies  Review of patient's allergies indicates no known allergies.  Home Medications   Prior to Admission medications   Medication Sig Start Date End Date Taking? Authorizing Provider  cephALEXin (KEFLEX) 500 MG capsule Take 500 mg by mouth 2 (two) times daily. 5 day course starting on 06/06/2015 06/06/15  Yes Historical Provider, MD  gabapentin (NEURONTIN) 250 MG/5ML solution Take 6 mLs by mouth every 8 (eight) hours. For nausea 06/01/15  Yes Historical Provider, MD   BP 111/65 mmHg  Pulse 70  Temp(Src) 97.4 F (36.3 C) (Oral)  Resp 16  Ht 5\' 5"  (1.651 m)  Wt 118.389 kg  BMI 43.43 kg/m2  SpO2 99%  LMP 05/28/2015 Physical Exam  Constitutional: She appears well-developed and well-nourished. No distress.  HENT:  Head: Normocephalic and atraumatic.  Mouth/Throat: Oropharynx is clear and moist. No oropharyngeal exudate.  Eyes: Conjunctivae and  EOM are normal. Pupils are equal, round, and reactive to light. Right eye exhibits no discharge. Left eye exhibits no discharge. No scleral icterus.  Neck: Normal range of motion. Neck supple.  Cardiovascular: Normal rate, regular rhythm, normal heart sounds and intact distal pulses.   No murmur heard. Pulmonary/Chest: Effort normal and breath sounds normal. No respiratory distress.  Abdominal: Soft. Bowel sounds are normal. There is no tenderness. There is CVA tenderness (left). There is no rigidity, no rebound and no guarding.    Abdomen is  obese, soft, and non-tender. Five well healing incisions noted on abdomen. No surrounding erythema, warmth, swelling, or discharge. No induration or fluctuance at incision sites noted.   Musculoskeletal: Normal range of motion.  Lymphadenopathy:    She has no cervical adenopathy.  Neurological: She is alert. Coordination normal.  Skin: Skin is warm and dry. She is not diaphoretic.  Psychiatric: She has a normal mood and affect. Her behavior is normal.    ED Course  Procedures (including critical care time) Labs Review Labs Reviewed  URINALYSIS, ROUTINE W REFLEX MICROSCOPIC (NOT AT [facility]) - Abnormal; Notable for the following:    Specific Gravity, Urine >1.030 (*)    Bilirubin Urine MODERATE (*)    Ketones, ur >80 (*)    Protein, ur 30 (*)    All other components within normal limits  COMPREHENSIVE METABOLIC PANEL - Abnormal; Notable for the following:    Sodium 133 (*)    Chloride 100 (*)    CO2 18 (*)    Total Protein 8.2 (*)    AST 71 (*)    ALT 100 (*)    Total Bilirubin 2.0 (*)    All other components within normal limits  LIPASE, BLOOD - Abnormal; Notable for the following:    Lipase 55 (*)    All other components within normal limits  URINE MICROSCOPIC-ADD ON - Abnormal; Notable for the following:    Squamous Epithelial / LPF 6-30 (*)    Bacteria, UA RARE (*)    All other components within normal limits  PREGNANCY, URINE  CBC WITH DIFFERENTIAL/PLATELET  LACTIC ACID, PLASMA  LACTIC ACID, PLASMA  COMPREHENSIVE METABOLIC PANEL    Imaging Review Dg Abd Acute W/chest  06/12/2015  CLINICAL DATA:  Recent gastric sleeve surgery.  Nausea and vomiting EXAM: DG ABDOMEN ACUTE W/ 1V CHEST COMPARISON:  03/12/2015 FINDINGS: There is no evidence of dilated bowel loops or free intraperitoneal air. No radiopaque calculi or other significant radiographic abnormality is seen. Heart size and mediastinal contours are within normal limits. Both lungs are clear. Gastric staples. IMPRESSION:  Negative abdominal radiographs.  No acute cardiopulmonary disease. Electronically Signed   By: Marlan Palau M.D.   On: 06/12/2015 23:18   I have personally reviewed and evaluated these images and lab results as part of my medical decision-making. No consolidation, effusions, or pneumothorax noted in chest film. No air under diaphragm. No dilated bowel loops. Mild stool noted in colon. Review in line with radiology report.    EKG Interpretation None      MDM   Final diagnoses:  None    Nancy Russell is a 25 y.o. female s/p gastric sleeve surgery on 05/27/15 presents to ED with complaint of generalized fatigue, nausea, vomiting, and dizziness. Patient is afebrile and non-toxic appearing. Vital signs are stable. Five well healing incisions are noted; no erythema, discharge, warmth, or swelling noted. Abdomen is soft and non-tender, positive bowel sounds. Will check CBC, CMP, lipase, UA,  and acute abdomen to evaluate for possible obstruction, constipation, infection. Give fluids and zofran for nausea relief.   Urinalysis positive for dehydration; some bacteria and squamous epithelial noted, no urinary complaints - ?specimen contamination. Will continue IVF. CBC unremarkable - doubt infectious etiology. Lipase and LFTs elevated - most likely secondary to recent abdominal surgery. Abnormal electrolytes - may be secondary to recent emesis vs. Decrease caloric intake/nutrient absorption following surgery and liquid diet. Abdominal radiographs show no dilated bowel loops, air under diaphragm, or acute pulmonary process. Concern remains for obstruction. Will order CT for further evaluation of abdomen.   At shift change, discussed findings thus far with patient and plan forward. Patient voiced understanding and is agreeable. Dr. Hyacinth Meeker discussed patient and plan with Dr. Preston Fleeting, signing over care.     Lona Kettle, PA-C 06/13/15 0981  Eber Hong, MD 06/16/15 [phone]

## 2015-06-12 NOTE — ED Notes (Signed)
Pt recently had bariatric surgery. Soon after surgery, the pt noted that one of her incisions were oozing pus. Pt was placed on an antibiotic for this. Pt now c/o feeling weak and having nausea and vomiting since around 12 pm today. Pt concerned about having another infection.

## 2015-06-13 ENCOUNTER — Encounter (HOSPITAL_COMMUNITY): Payer: Self-pay | Admitting: Internal Medicine

## 2015-06-13 DIAGNOSIS — E871 Hypo-osmolality and hyponatremia: Secondary | ICD-10-CM

## 2015-06-13 DIAGNOSIS — R112 Nausea with vomiting, unspecified: Secondary | ICD-10-CM | POA: Diagnosis present

## 2015-06-13 DIAGNOSIS — D735 Infarction of spleen: Secondary | ICD-10-CM | POA: Diagnosis not present

## 2015-06-13 DIAGNOSIS — E876 Hypokalemia: Secondary | ICD-10-CM

## 2015-06-13 DIAGNOSIS — K7689 Other specified diseases of liver: Secondary | ICD-10-CM | POA: Diagnosis not present

## 2015-06-13 DIAGNOSIS — E872 Acidosis, unspecified: Secondary | ICD-10-CM

## 2015-06-13 DIAGNOSIS — R945 Abnormal results of liver function studies: Secondary | ICD-10-CM

## 2015-06-13 LAB — COMPREHENSIVE METABOLIC PANEL
ALBUMIN: 3.9 g/dL (ref 3.5–5.0)
ALK PHOS: 48 U/L (ref 38–126)
ALK PHOS: 50 U/L (ref 38–126)
ALT: 78 U/L — AB (ref 14–54)
ALT: 77 U/L — AB (ref 14–54)
AST: 54 U/L — AB (ref 15–41)
AST: 53 U/L — ABNORMAL HIGH (ref 15–41)
Albumin: 3.8 g/dL (ref 3.5–5.0)
Anion gap: 12 (ref 5–15)
Anion gap: 12 (ref 5–15)
BILIRUBIN TOTAL: 1.6 mg/dL — AB (ref 0.3–1.2)
BILIRUBIN TOTAL: 1.7 mg/dL — AB (ref 0.3–1.2)
BUN: 6 mg/dL (ref 6–20)
BUN: 5 mg/dL — ABNORMAL LOW (ref 6–20)
CALCIUM: 8.5 mg/dL — AB (ref 8.9–10.3)
CALCIUM: 8.4 mg/dL — AB (ref 8.9–10.3)
CO2: 18 mmol/L — ABNORMAL LOW (ref 22–32)
CO2: 18 mmol/L — AB (ref 22–32)
CREATININE: 0.77 mg/dL (ref 0.44–1.00)
Chloride: 105 mmol/L (ref 101–111)
Chloride: 103 mmol/L (ref 101–111)
Creatinine, Ser: 0.75 mg/dL (ref 0.44–1.00)
GFR calc Af Amer: >60 mL/min (ref >60)
GFR calc non Af Amer: >60 mL/min (ref >60)
GFR calc non Af Amer: >60 mL/min (ref >60)
GLUCOSE: 92 mg/dL (ref 65–99)
Glucose, Bld: 92 mg/dL (ref 65–99)
Potassium: 2.9 mmol/L — ABNORMAL LOW (ref 3.5–5.1)
Potassium: 3 mmol/L — ABNORMAL LOW (ref 3.5–5.1)
SODIUM: 133 mmol/L — AB (ref 135–145)
Sodium: 135 mmol/L (ref 135–145)
TOTAL PROTEIN: 6.9 g/dL (ref 6.5–8.1)
Total Protein: 6.9 g/dL (ref 6.5–8.1)

## 2015-06-13 LAB — CBC
HCT: 37.6 % (ref 36.0–46.0)
Hemoglobin: 13 g/dL (ref 12.0–15.0)
MCH: 31 pg (ref 26.0–34.0)
MCHC: 34.6 g/dL (ref 30.0–36.0)
MCV: 89.5 fL (ref 78.0–100.0)
Platelets: 306 10*3/uL (ref 150–400)
RBC: 4.2 MIL/uL (ref 3.87–5.11)
RDW: 12.7 % (ref 11.5–15.5)
WBC: 8.6 10*3/uL (ref 4.0–10.5)

## 2015-06-13 LAB — LACTIC ACID, PLASMA: Lactic Acid, Venous: 0.7 mmol/L (ref 0.5–2.0)

## 2015-06-13 LAB — APTT: aPTT: 25 seconds (ref 24–37)

## 2015-06-13 LAB — PROTIME-INR
INR: 1.17 (ref 0.00–1.49)
PROTHROMBIN TIME: 15.1 s (ref 11.6–15.2)

## 2015-06-13 MED ORDER — ENOXAPARIN SODIUM 120 MG/0.8ML ~~LOC~~ SOLN
SUBCUTANEOUS | Status: AC
  Filled 2015-06-13: qty 0.8

## 2015-06-13 MED ORDER — SODIUM CHLORIDE 0.9 % IV SOLN
INTRAVENOUS | Status: DC
Start: 2015-06-13 — End: 2015-06-13
  Administered 2015-06-13: 05:00:00 via INTRAVENOUS

## 2015-06-13 MED ORDER — SODIUM CHLORIDE 0.9 % IV BOLUS (SEPSIS)
500.0000 mL | Freq: Once | INTRAVENOUS | Status: AC
  Administered 2015-06-13: 500 mL via INTRAVENOUS

## 2015-06-13 MED ORDER — ONDANSETRON HCL 4 MG/2ML IJ SOLN: 4.0000 mg | Freq: Four times a day (QID) | INTRAMUSCULAR | Status: DC | PRN

## 2015-06-13 MED ORDER — CEPHALEXIN 500 MG PO CAPS
500.0000 mg | ORAL_CAPSULE | Freq: Two times a day (BID) | ORAL | Status: DC
  Administered 2015-06-13: 500 mg via ORAL
  Filled 2015-06-13: qty 1

## 2015-06-13 MED ORDER — ONDANSETRON HCL 4 MG/2ML IJ SOLN
4.0000 mg | Freq: Once | INTRAMUSCULAR | Status: AC
  Administered 2015-06-13: 4 mg via INTRAVENOUS
  Filled 2015-06-13: qty 2

## 2015-06-13 MED ORDER — POTASSIUM CHLORIDE 20 MEQ PO PACK
40.0000 meq | PACK | ORAL | Status: DC
  Administered 2015-06-13: 40 meq via ORAL
  Filled 2015-06-13: qty 2

## 2015-06-13 MED ORDER — SODIUM CHLORIDE 0.9% FLUSH
3.0000 mL | Freq: Two times a day (BID) | INTRAVENOUS | Status: DC
  Administered 2015-06-13: 3 mL via INTRAVENOUS

## 2015-06-13 MED ORDER — FAMOTIDINE 20 MG PO TABS
10.0000 mg | ORAL_TABLET | Freq: Every day | ORAL | Status: DC
  Administered 2015-06-13: 10 mg via ORAL
  Filled 2015-06-13: qty 1

## 2015-06-13 MED ORDER — POTASSIUM CHLORIDE 20 MEQ PO PACK: 40.0000 meq | PACK | Freq: Two times a day (BID) | ORAL | Status: DC

## 2015-06-13 MED ORDER — IOPAMIDOL (ISOVUE-300) INJECTION 61%
100.0000 mL | Freq: Once | INTRAVENOUS | Status: AC | PRN
  Administered 2015-06-13: 100 mL via INTRAVENOUS

## 2015-06-13 MED ORDER — ONDANSETRON HCL 4 MG/2ML IJ SOLN: 4.0000 mg | Freq: Three times a day (TID) | INTRAMUSCULAR | Status: DC | PRN

## 2015-06-13 MED ORDER — ENOXAPARIN SODIUM 120 MG/0.8ML ~~LOC~~ SOLN
1.0000 mg/kg | Freq: Two times a day (BID) | SUBCUTANEOUS | Status: DC
  Administered 2015-06-13: 120 mg via SUBCUTANEOUS
  Filled 2015-06-13 (×3): qty 0.8

## 2015-06-13 MED ORDER — SODIUM CHLORIDE 0.9 % IV SOLN
INTRAVENOUS | Status: DC
  Administered 2015-06-13: 05:00:00 via INTRAVENOUS

## 2015-06-13 NOTE — ED Notes (Signed)
Right arm sitting 106/67

## 2015-06-13 NOTE — Progress Notes (Signed)
Patient alert and oriented, independent, VSS, pt. Tolerating bariatric diet well. No complaints of pain or nausea. Pt. Had IV removed tip intact. Pt. Had prescriptions given. Pt. Voiced understanding of discharge instructions with no further questions. Pt. Discharged via wheelchair with auxilliary.

## 2015-06-13 NOTE — H&P (Addendum)
TRH H&P   Patient Demographics:    Nancy Russell, is a 25 y.o. female  MRN: [id number]   DOB - December 20, 1990  Admit Date - 06/12/2015  Outpatient Primary MD for the patient is No PCP Per Patient  Referring MD/NP/PA: Dione Booze   Outpatient Specialists: [name]  Patient coming from: home  Chief Complaint  Patient presents with  . Emesis      HPI:    Nancy Russell  is a 25 y.o. female, morbidly obese female with hx of gastric sleeve 05/27/2015, negative for fhx of blood clots, who presents with n/v.  x6 earlier today.  No hematemsis. Pt denies fever, chills, abdominal pain, diarrhea, brbpr, black stool.  Pt notes slight dizziness w standing.  Denies vertigo, cp, palp, sob, weakness, numbness, tingling. Pt presented to ED for evaluation of n/v, and was found to have abnormal liver function, metaboloic acidosis, abnormal liver function and mild hyponatremia.     Review of systems:    In addition to the HPI above No Fever-chills, No Headache, No changes with Vision or hearing, No problems swallowing food or Liquids, No Chest pain, Cough or Shortness of Breath, No Abdominal pain,  Bowel movements are regular, No Blood in stool or Urine, No dysuria, No new skin rashes or bruises, No new joints pains-aches,  No new weakness, tingling, numbness in any extremity, No recent weight gain or loss, No polyuria, polydypsia or polyphagia, No significant Mental Stressors.  A full 10 point Review of Systems was done, except as stated above, all other Review of Systems were negative.   With Past History of the following :    Past Medical History  Diagnosis Date  . Headache(784.0)   . Vaginal Pap smear, abnormal   . Nexplanon in place 02/12/2015  . Nexplanon removal 02/26/2015  . 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 Cystectomy  at age 42    cyst removed from my chest   . Cesarean section  01/05/2012    Procedure: CESAREAN SECTION;  Surgeon: Lazaro Arms, MD;  Location: [facility];  Service: Obstetrics;  Laterality: N/A;  . Abdominal surgery      gastrectomy  . Esophagogastroduodenoscopy  03/15/2015      Social History:     Social History  Substance Use Topics  . Smoking status: Never Smoker   . Smokeless tobacco: Never Used  . Alcohol Use: No     Lives - at home   Mobility - ambulates    Family History :     Family History  Problem Relation Age of Onset  . Anesthesia problems Neg Hx   . Hypotension Neg Hx   . Malignant hyperthermia Neg Hx   . Pseudochol deficiency Neg Hx   . Other Neg Hx  Home Medications:   Prior to Admission medications   Medication Sig Start Date End Date Taking? Authorizing Provider  cephALEXin (KEFLEX) 500 MG capsule Take 500 mg by mouth 2 (two) times daily. 5 day course starting on 06/06/2015 06/06/15  Yes Historical Provider, MD  gabapentin (NEURONTIN) 250 MG/5ML solution Take 6 mLs by mouth every 8 (eight) hours. For nausea 06/01/15  Yes Historical Provider, MD     Allergies:    No Known Allergies   Physical Exam:   Vitals  Blood pressure 108/63, pulse 68, temperature 97.4 F (36.3 C), temperature source Oral, resp. rate 16, height 5\' 5"  (1.651 m), weight 118.389 kg (261 lb), last menstrual period 05/28/2015, SpO2 98 %.   1. General young white female , morbidly obese,  lying in bed in NAD  2. Normal affect and insight, Not Suicidal or Homicidal, Awake Alert, Oriented X 3.  3. No F.N deficits, ALL C.Nerves Intact, Strength 5/5 all 4 extremities, Sensation intact all 4 extremities, Plantars down going.  4. Ears and Eyes appear Normal, Conjunctivae clear, PERRLA. Moist Oral Mucosa.  5. Supple Neck, No JVD, No cervical lymphadenopathy appriciated, No Carotid Bruits.  6. Symmetrical Chest wall movement, Good air movement  bilaterally, CTAB.  7. RRR, No Gallops, Rubs or Murmurs, No Parasternal Heave.  8. Positive Bowel Sounds, Abdomen Soft, No tenderness, No organomegaly appriciated,No rebound -guarding or rigidity.  9.  No Cyanosis, Normal Skin Turgor, No Skin Rash or Bruise. + incisions from laparascopic gastric sleeve bypass healed.   10. Good muscle tone,  joints appear normal , no effusions, Normal ROM.  11. No Palpable Lymph Nodes in Neck or Axillae     Data Review:    CBC  Recent Labs Lab 06/12/15 2200  WBC 9.8  HGB 14.7  HCT 42.7  PLT 300  MCV 88.8  MCH 30.6  MCHC 34.4  RDW 12.6  LYMPHSABS 2.4  MONOABS 0.6  EOSABS 0.0  BASOSABS 0.0   ------------------------------------------------------------------------------------------------------------------  Chemistries   Recent Labs Lab 06/12/15 2200  NA 133*  K 3.7  CL 100*  CO2 18*  GLUCOSE 95  BUN 6  CREATININE 0.78  CALCIUM 9.4  AST 71*  ALT 100*  ALKPHOS 63  BILITOT 2.0*   ------------------------------------------------------------------------------------------------------------------ estimated creatinine clearance is 136.1 mL/min (by C-G formula based on Cr of 0.78). ------------------------------------------------------------------------------------------------------------------ No results for input(s): TSH, T4TOTAL, T3FREE, THYROIDAB in the last 72 hours.  Invalid input(s): FREET3  Coagulation profile No results for input(s): INR, PROTIME in the last 168 hours. ------------------------------------------------------------------------------------------------------------------- No results for input(s): DDIMER in the last 72 hours. -------------------------------------------------------------------------------------------------------------------  Cardiac Enzymes No results for input(s): CKMB, TROPONINI, MYOGLOBIN in the last 168 hours.  Invalid input(s):  CK ------------------------------------------------------------------------------------------------------------------ No results found for: BNP   ---------------------------------------------------------------------------------------------------------------  Urinalysis    Component Value Date/Time   COLORURINE YELLOW 06/12/2015 2220   APPEARANCEUR CLEAR 06/12/2015 2220   LABSPEC >1.030* 06/12/2015 2220   PHURINE 6.0 06/12/2015 2220   GLUCOSEU NEGATIVE 06/12/2015 2220   HGBUR NEGATIVE 06/12/2015 2220   BILIRUBINUR MODERATE* 06/12/2015 2220   KETONESUR >80* 06/12/2015 2220   PROTEINUR 30* 06/12/2015 2220   UROBILINOGEN 0.2 08/09/2014 2020   NITRITE NEGATIVE 06/12/2015 2220   LEUKOCYTESUR NEGATIVE 06/12/2015 2220    ----------------------------------------------------------------------------------------------------------------   Imaging Results:    Ct Abdomen Pelvis W Contrast  06/13/2015  CLINICAL DATA:  Nausea and vomiting for 2 days. Recent infection, treated with antibiotics. EXAM: CT ABDOMEN AND PELVIS WITH CONTRAST TECHNIQUE: Multidetector CT imaging of the  abdomen and pelvis was performed using the standard protocol following bolus administration of intravenous contrast. CONTRAST:  100mL ISOVUE-300 IOPAMIDOL (ISOVUE-300) INJECTION 61% COMPARISON:  08/09/2014 FINDINGS: Lower chest:  No significant abnormality Hepatobiliary: There are normal appearances of the liver, gallbladder and bile ducts. Pancreas: Normal Spleen: There is a 2 x 3 by 4.6 cm demarcated peripheral hypodensity of the spleen at its medial superior aspect, with characteristics most consistent with a subacute splenic infarction. This is the portion of the spleen closest of the stomach. This presumed infarction involves a very small fraction of the splenic parenchyma, estimated to be less than 10%. Adrenals/Urinary Tract: The adrenals and kidneys are normal in appearance. There is no urinary calculus evident. There is no  hydronephrosis or ureteral dilatation. Collecting systems and ureters appear unremarkable. Stomach/Bowel: There is a gastric surgical staple line which most likely represents a bariatric sleeve procedure. Stomach and small bowel are otherwise unremarkable. Appendix is normal. Colon is unremarkable. Vascular/Lymphatic: The abdominal aorta is normal in caliber. There is no atherosclerotic calcification. There is no adenopathy in the abdomen or pelvis. Reproductive: Uterus and ovaries appear normal. Other: No acute inflammatory changes are evident in the abdomen or pelvis. There is no abnormal collection. There is no ascites. No focal inflammatory changes are evident. Slight subcutaneous stranding opacity likely corresponds to laparoscopic port location. No subcutaneous collection. Musculoskeletal: No significant skeletal lesion. 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 IMPRESSION: 1. 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 This involves only a small portion of the splenic parenchyma, probably 10% or less. 2. No abscess, soft tissue gas, ascites, or focal inflammatory changes. Electronically Signed   By: Ellery Plunkaniel R Mitchell M.D.   On: 06/13/2015 02:17   Dg Abd Acute W/chest  06/12/2015  CLINICAL DATA:  Recent gastric sleeve surgery.  Nausea and vomiting EXAM: DG ABDOMEN ACUTE W/ 1V CHEST COMPARISON:  03/12/2015 FINDINGS: There is no evidence of dilated bowel loops or free intraperitoneal air. No radiopaque calculi or other significant radiographic abnormality is seen. Heart size and mediastinal contours are within normal limits. Both lungs are clear. Gastric staples. IMPRESSION: Negative abdominal radiographs.  No acute cardiopulmonary disease. Electronically Signed   By: Marlan Palauharles  Clark M.D.   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 Hyponatremia secondary to ? N/v Hydrate with ns iv  3. Abnormal lft Consider Check ultrasound if pt continues to have n/v Check cmp tomorrow,  If not improving consider further w/up w ferritin, iron, tibc, ceruloplasmin, alpha 1 antitripsin, ana, antismooth muscle ab, celiac panel  4. Metabolic acidosis secondary to n/v ? Hydrate with ns iv, check cmp tomorrow.   5. N/v zofran , pepcid  DVT Prophylaxis Lovenox   AM Labs Ordered, also please review Full Orders  Family Communication: Admission, patients condition and plan of care including tests being ordered have been discussed with the patient  who indicate understanding and agree with the plan and Code Status.  Code Status FULL CODE  Likely DC to  home  Condition GUARDED    Consults called: none  Admission status: obs  Time spent in minutes : 45   Pearson Grippe M.D on 06/13/2015 at 3:39 AM  [phone]  Between 7am to 7pm - Pager - [phone]. After 7pm go to [email or web address] - password [facility]  [facility] - Office  [phone]

## 2015-06-13 NOTE — Progress Notes (Signed)
PROGRESS NOTE  RITAMARIE ARKIN ZOX:[id number] DOB: [birth year] DOA: 06/12/2015 PCP: No PCP Per Patient  Brief Narrative: 73 yof with a hx of morbid obesity, and gastric sleeve 05/27/15, presented with n/v. No hematemesis, abd pain, diarrhea, or bloody stool was noted. Pt was found to have abnormal liver function, metabolic acidosis, and mild hyponatremia. Pt was referred for admission.   Assessment/Plan: 1. Nausea, vomiting. Resolved. Etiology unclear. CT abdomen and pelvis without any acute features. 2. 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 Reports compliance with liquid diet. Stable. 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 Discussed with surgeon as below, this is an expected finding. There is no indication for anticoagulation.  4. Modest hyponatremia secondary to dehydration. Possibly secondary to nausea and vomiting. 5. Hypokalemia. Secondary to nausea and vomiting. 6. Abnormal LFTs. Likely secondary to dehydration. 7. Metabolic acidosis. Possibly secondary to GI losses.   Feels well, no nausea or vomiting, tolerating liquids, has no pain and desires discharge. His been no fever or leukocytosis or any evidence to suggest infection.  Discussed case in detail with bariatric fellow on-call at [facility] Dr. Lennie Hummer. He reports that splenic infarction is to be expected in these cases based on the surgical approach and ligation of vessels. There is no treatment indicated. Elevated LFTs are often seen in patients with bariatric surgery when they become dehydrated from nausea and vomiting. He recommended IV fluids and the patient will be followed up at [facility].   IV fluids, replete potassium  Home today  Brendia Sacks, MD  [facility] Direct contact:  --Via amion app OR  --[email or web address]; password [email or web address] and click  7PM-7AM contact night coverage as above 06/13/2015, 6:43 AM     Consultants:  none  Procedures:  none  Antimicrobials:  none  HPI/Subjective: Feels okay today. No nausea or vomiting. Is breathing well and denies any abd pain. Denies any back or side pain. Has maintained her liquid diet at home. Wants to go home.  Objective: Filed Vitals:   06/12/15 2138 06/13/15 0001 06/13/15 0216 06/13/15 0421  BP: 135/81 111/65 108/63 127/66  Pulse: 88 70 68 68  Temp: 97.5 F (36.4 C) 97.4 F (36.3 C) 97.4 F (36.3 C) 97.6 F (36.4 C)  TempSrc: Temporal Oral Oral Oral  Resp: Height:  (1.651 m)    (1.651 m)  Weight: 118.389 kg (261 lb)   119.523 kg (263 lb 8 oz)  SpO2: 98% 99% 98% 100%   No intake or output data in the 24 hours ending 06/13/15 0643   Filed Weights   06/12/15 2138 06/13/15 0421  Weight: 118.389 kg (261 lb) 119.523 kg (263 lb 8 oz)    Exam:    Constitutional:  . Appears calm and comfortable Respiratory:  . CTA bilaterally, no w/r/r.  . Respiratory effort normal. No retractions or accessory muscle use Cardiovascular:  . RRR, no m/r/g . No LE extremity edema   . Telemetry SR Abdomen:  . Abdomen obese; no tenderness or masses - 5 small incisions which are well healing. Well approximated. No drainage. No pain.   Psychiatric:  . judgement and insight appear normal . Mental status o Mood, affect appropriate   I have personally reviewed following labs and imaging studies:  Potassium 3.0  BUN and Cr unremarkable.  AST, ALT, total bilirubin trending down  CBC unremarkable  CT unremarkable except for small splenic infarction.  Scheduled Meds: . sodium chloride   Intravenous STAT  . cephALEXin  500 mg Oral BID  . enoxaparin (LOVENOX) injection  1 mg/kg Subcutaneous Q12H  . famotidine  10 mg Oral Daily  . sodium chloride  1,000 mL Intravenous Once  . sodium chloride flush  3 mL Intravenous Q12H   Continuous Infusions: . sodium chloride 75 mL/hr at 06/13/15 0445    Principal Problem:    Infarction of spleen Active Problems:   Acidosis   Abnormal liver function   Hyponatremia   Splenic infarction       By signing my name below, I, Adron BeneGreylon Gawaluck, attest that this documentation has been prepared under the direction and in the presence of Mona Ayars P. Irene LimboGoodrich, MD. Electronically Signed: Adron BeneGreylon Gawaluck, Scribe.  06/13/2015 12:05pm  I personally performed the services described in this documentation. All medical record entries made by the scribe were at my direction. I have reviewed the chart and agree that the record reflects my personal performance and is accurate and complete. Brendia Sacksaniel Izel Hochberg, MD

## 2015-06-13 NOTE — Discharge Summary (Signed)
Physician Discharge Summary  Nancy Russell ZOX:[id number] DOB: March 21, 1990 DOA: 06/12/2015  PCP: No PCP Per Patient  Bariatric surgeon [name]  Admit date: 06/12/2015 Discharge date: 06/13/2015  Recommendations for Outpatient Follow-up:  1. Keep follow-up with bariatric surgeon as scheduled   Discharge Diagnoses:  1. Nausea, vomiting 2. Status post bariatric surgery 3. Splenic infarction 4. Hyponatremia 5. Hypokalemia 6. Elevated LFTs 7. Metabolic acidosis  Discharge Condition: Improved Disposition: Discharge home  Diet recommendation: Regular  Filed Weights   06/12/15 2138 06/13/15 0421  Weight: 118.389 kg (261 lb) 119.523 kg (263 lb 8 oz)    History of present illness:  64 yof with a hx of morbid obesity, and gastric sleeve 05/27/15, presented with n/v. No hematemesis, abd pain, diarrhea, or bloody stool was noted. Pt was found to have abnormal liver function, metabolic acidosis, and mild hyponatremia. Pt was referred for admission.     Hospital Course:  Was observed overnight and treated with supportive care, IV fluids. Nausea and vomiting spontaneously resolved. She is tolerating a diet, has no abdominal pain, fever or signs of infection. CT abdomen and pelvis with no acute features to suggest complication from surgery. Mild hypokalemia and hyponatremia were addressed. LFTs improved. Hospitalization was uncomplicated  1. Nausea, vomiting. Resolved. Etiology unclear. CT abdomen and pelvis without any acute features. 2. 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 Reports compliance with liquid diet. Stable. 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 Discussed with surgeon as below, this is an expected finding. There is no indication for anticoagulation.  4. Modest hyponatremia secondary to dehydration. Possibly secondary to nausea and vomiting. 5. Hypokalemia. Secondary to nausea and vomiting. 6. Abnormal LFTs. Likely secondary to  dehydration. 7. Metabolic acidosis. Possibly secondary to GI losses.  Discussed case in detail with bariatric fellow on-call at [facility] Dr. Lennie Hummer. He reports that splenic infarction is to be expected in these cases based on the surgical approach and ligation of vessels. There is no treatment indicated. Elevated LFTs are often seen in patients with bariatric surgery when they become dehydrated from nausea and vomiting. He recommended IV fluids and the patient will be followed up at [facility]   Consultants:  none  Procedures:  none  Antimicrobials:  none  Discharge Instructions  Discharge Instructions    Activity as tolerated - No restrictions    Complete by:  As directed      Discharge instructions    Complete by:  As directed   Call your physician or seek immediate medical attention for recurrent vomiting, nausea, abdominal pain, worsening of condition. Continue your diet as instructed by her bariatric surgeon.          Current Discharge Medication List    START taking these medications   Details  potassium chloride (KLOR-CON) 20 MEQ packet Take 40 mEq by mouth 2 (two) times daily. Qty: 6 packet, Refills: 0      CONTINUE these medications which have NOT CHANGED   Details  cephALEXin (KEFLEX) 500 MG capsule Take 500 mg by mouth 2 (two) times daily. 5 day course starting on 06/06/2015 Refills: 0    gabapentin (NEURONTIN) 250 MG/5ML solution Take 6 mLs by mouth every 8 (eight) hours. For nausea Refills: 0       No Known Allergies  The results of significant diagnostics from this hospitalization (including imaging, microbiology, ancillary and laboratory) are listed below for reference.    Significant Diagnostic Studies: Ct Abdomen Pelvis W Contrast  06/13/2015  CLINICAL DATA:  Nausea and vomiting for 2 days. Recent infection, treated with antibiotics. EXAM: CT ABDOMEN AND PELVIS WITH CONTRAST TECHNIQUE: Multidetector CT imaging of the abdomen and pelvis was performed  using the standard protocol following bolus administration of intravenous contrast. CONTRAST:  ISOVUE-300 IOPAMIDOL (ISOVUE-300) INJECTION 61% COMPARISON:  08/09/2014 FINDINGS: Lower chest:  No significant abnormality Hepatobiliary: There are normal appearances of the liver, gallbladder and bile ducts. Pancreas: Normal Spleen: There is a 2 x 3 by 4.6 cm demarcated peripheral hypodensity of the spleen at its medial superior aspect, with characteristics most consistent with a subacute splenic infarction. This is the portion of the spleen closest of the stomach. This presumed infarction involves a very small fraction of the splenic parenchyma, estimated to be less than 10%. Adrenals/Urinary Tract: The adrenals and kidneys are normal in appearance. There is no urinary calculus evident. There is no hydronephrosis or ureteral dilatation. Collecting systems and ureters appear unremarkable. Stomach/Bowel: There is a gastric surgical staple line which most likely represents a bariatric sleeve procedure. Stomach and small bowel are otherwise unremarkable. Appendix is normal. Colon is unremarkable. Vascular/Lymphatic: The abdominal aorta is normal in caliber. There is no atherosclerotic calcification. There is no adenopathy in the abdomen or pelvis. Reproductive: Uterus and ovaries appear normal. Other: No acute inflammatory changes are evident in the abdomen or pelvis. There is no abnormal collection. There is no ascites. No focal inflammatory changes are evident. Slight subcutaneous stranding opacity likely corresponds to laparoscopic port location. No subcutaneous collection. Musculoskeletal: No significant skeletal lesion. 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 IMPRESSION: 1. 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 This involves only a small portion of the splenic parenchyma, probably 10% or less. 2. No  abscess, soft tissue gas, ascites, or focal inflammatory changes. Electronically Signed   By: Ellery Plunk M.D.   On: 06/13/2015 02:17   Dg Abd Acute W/chest  06/12/2015  CLINICAL DATA:  Recent gastric sleeve surgery.  Nausea and vomiting EXAM: DG ABDOMEN ACUTE W/ 1V CHEST COMPARISON:  03/12/2015 FINDINGS: There is no evidence of dilated bowel loops or free intraperitoneal air. No radiopaque calculi or other significant radiographic abnormality is seen. Heart size and mediastinal contours are within normal limits. Both lungs are clear. Gastric staples. IMPRESSION: Negative abdominal radiographs.  No acute cardiopulmonary disease. Electronically Signed   By: Marlan Palau M.D.   On: 06/12/2015 23:18   Labs: Basic Metabolic Panel:  Recent Labs Lab 06/12/15 2200 06/13/15 0626 06/13/15 1254  NA 133* 135 133*  K 3.7 2.9* 3.0*  CL 100* 105 103  CO2 18* 18* 18*  GLUCOSE 95 92 92  BUN 6 6 5*  CREATININE 0.78 0.75 0.77  CALCIUM 9.4 8.5* 8.4*   Liver Function Tests:  Recent Labs Lab 06/12/15 2200 06/13/15 0626 06/13/15 1254  AST 71* 54* 53*  ALT 100* 78* 77*  ALKPHOS 63 48 50  BILITOT 2.0* 1.6* 1.7*  PROT 8.2* 6.9 6.9  ALBUMIN 4.8 3.9 3.8    Recent Labs Lab 06/12/15 2200  LIPASE 55*   CBC:  Recent Labs Lab 06/12/15 2200 06/13/15 0626  WBC 9.8 8.6  NEUTROABS 6.8  --   HGB 14.7 13.0  HCT 42.7 37.6  MCV 88.8 89.5  PLT 300 306      Principal Problem:   Nausea with vomiting Active Problems:   Infarction of spleen   Abnormal liver function   Hyponatremia  Hypokalemia   Time coordinating discharge: 35 minutes  Signed:  Brendia Sacksaniel Goodrich, MD [facility] 06/13/2015, 3:09 PM  By signing my name below, I, Adron BeneGreylon Gawaluck, attest that this documentation has been prepared under the direction and in the presence of Daniel P. Irene LimboGoodrich, MD. Electronically Signed: Adron BeneGreylon Gawaluck, Scribe.  06/13/2015 12:05pm  I personally performed the services described  in this documentation. All medical record entries made by the scribe were at my direction. I have reviewed the chart and agree that the record reflects my personal performance and is accurate and complete. Brendia Sacksaniel Goodrich, MD

## 2015-06-13 NOTE — ED Provider Notes (Signed)
Patient initially seen and evaluated by Dr. Hyacinth MeekerMiller presenting with nausea and vomiting several weeks following bariatric surgery. Abdominal x-rays did not show evidence of obstruction and she is sent for CT scan. CT shows infarction of the spleen. Curiously, she has not had significant abdominal pain. However, laboratory evaluation does show non-anion gap metabolic acidosis as well as mild elevation of transaminases and bilirubin. It is felt that she should be monitored following this. Case is discussed with Dr. Selena BattenKim of [facility] who agrees to admit the patient under observation status.  Dione Boozeavid Dell Hurtubise, MD 06/13/15 [phone]

## 2015-06-15 DIAGNOSIS — Z9884 Bariatric surgery status: Secondary | ICD-10-CM | POA: Insufficient documentation

## 2015-10-25 ENCOUNTER — Ambulatory Visit: Payer: BLUE CROSS/BLUE SHIELD | Admitting: Women's Health

## 2015-10-25 ENCOUNTER — Other Ambulatory Visit: Payer: BLUE CROSS/BLUE SHIELD

## 2015-10-26 ENCOUNTER — Encounter: Payer: Self-pay | Admitting: Women's Health

## 2015-10-26 ENCOUNTER — Ambulatory Visit (INDEPENDENT_AMBULATORY_CARE_PROVIDER_SITE_OTHER): Payer: BLUE CROSS/BLUE SHIELD | Admitting: Women's Health

## 2015-10-26 ENCOUNTER — Other Ambulatory Visit: Payer: BLUE CROSS/BLUE SHIELD

## 2015-10-26 VITALS — BP 104/58 | HR 76 | Wt 215.0 lb

## 2015-10-26 DIAGNOSIS — Z30014 Encounter for initial prescription of intrauterine contraceptive device: Secondary | ICD-10-CM

## 2015-10-26 DIAGNOSIS — Z975 Presence of (intrauterine) contraceptive device: Secondary | ICD-10-CM

## 2015-10-26 DIAGNOSIS — Z30430 Encounter for insertion of intrauterine contraceptive device: Secondary | ICD-10-CM | POA: Diagnosis not present

## 2015-10-26 DIAGNOSIS — Z3049 Encounter for surveillance of other contraceptives: Secondary | ICD-10-CM

## 2015-10-26 HISTORY — DX: Encounter for insertion of intrauterine contraceptive device: Z30.430

## 2015-10-26 LAB — BETA HCG QUANT (REF LAB): hCG Quant: <1 m[IU]/mL

## 2015-10-26 NOTE — Progress Notes (Signed)
Sherene Siresaomi N Hagan is a 25 y.o. year old [id number] Caucasian female who presents for placement of a Mirena IUD.  Patient's last menstrual period was 10/11/2015 (approximate). BP (!) 104/58 (BP Location: Right Arm, Patient Position: Sitting, Cuff Size: Large)   Pulse 76   Wt 215 lb (97.5 kg)   LMP 10/11/2015 (Approximate)   BMI 35.78 kg/m  Last sexual intercourse was 2wks ago, and pregnancy test today was neg  The risks and benefits of the method and placement have been thouroughly reviewed with the patient and all questions were answered.  Specifically the patient is aware of failure rate of 01/998, expulsion of the IUD and of possible perforation.  The patient is aware of irregular bleeding due to the method and understands the incidence of irregular bleeding diminishes with time.  Signed copy of informed consent in chart.   Time out was performed.  A graves speculum was placed in the vagina.  The cervix was visualized, prepped using Betadine, and grasped with a single tooth tenaculum. The uterus was found to be neutral and it sounded to 8 cm.  Mirena IUD placed per manufacturer's recommendations.   The strings were trimmed to 3 cm.  Sonogram was performed and the proper placement of the IUD was verified via transvaginal u/s by me and amber, ultrasonographer.   The patient was given post procedure instructions, including signs and symptoms of infection and to check for the strings after each menses or each month, and refraining from intercourse or anything in the vagina for 3 days.  She was given a Mirena care card with date IUD placed, and date IUD to be removed.  She is scheduled for a f/u appointment in 4 weeks.  Marge DuncansBooker, Veasna Santibanez Randall CNM, [facility]NP-BC 10/26/2015 4:25 PM

## 2015-10-26 NOTE — Patient Instructions (Signed)
 Nothing in vagina for 3 days (no sex, douching, tampons, etc...)  Check your strings once a month to make sure you can feel them, if you are not able to please let us know  If you develop a fever of 100.4 or more in the next few weeks, or if you develop severe abdominal pain, please let us know  Use a backup method of birth control, such as condoms, for 2 weeks    Intrauterine Device Insertion, Care After Refer to this sheet in the next few weeks. These instructions provide you with information on caring for yourself after your procedure. Your health care provider may also give you more specific instructions. Your treatment has been planned according to current medical practices, but problems sometimes occur. Call your health care provider if you have any problems or questions after your procedure. WHAT TO EXPECT AFTER THE PROCEDURE Insertion of the IUD may cause some discomfort, such as cramping. The cramping should improve after the IUD is in place. You may have bleeding after the procedure. This is normal. It varies from light spotting for a few days to menstrual-like bleeding. When the IUD is in place, a string will extend past the cervix into the vagina for 1-2 inches. The strings should not bother you or your partner. If they do, talk to your health care provider.  HOME CARE INSTRUCTIONS   Check your intrauterine device (IUD) to make sure it is in place before you resume sexual activity. You should be able to feel the strings. If you cannot feel the strings, something may be wrong. The IUD may have fallen out of the uterus, or the uterus may have been punctured (perforated) during placement. Also, if the strings are getting longer, it may mean that the IUD is being forced out of the uterus. You no longer have full protection from pregnancy if any of these problems occur.  You may resume sexual intercourse if you are not having problems with the IUD. The copper IUD is considered immediately  effective, and the hormone IUD works right away if inserted within 7 days of your period starting. You will need to use a backup method of birth control for 7 days if the IUD in inserted at any other time in your cycle.  Continue to check that the IUD is still in place by feeling for the strings after every menstrual period.  You may need to take pain medicine such as acetaminophen or ibuprofen. Only take medicines as directed by your health care provider. SEEK MEDICAL CARE IF:   You have bleeding that is heavier or lasts longer than a normal menstrual cycle.  You have a fever.  You have increasing cramps or abdominal pain not relieved with medicine.  You have abdominal pain that does not seem to be related to the same area of earlier cramping and pain.  You are lightheaded, unusually weak, or faint.  You have abnormal vaginal discharge or smells.  You have pain during sexual intercourse.  You cannot feel the IUD strings, or the IUD string has gotten longer.  You feel the IUD at the opening of the cervix in the vagina.  You think you are pregnant, or you miss your menstrual period.  The IUD string is hurting your sex partner. MAKE SURE YOU:  Understand these instructions.  Will watch your condition.  Will get help right away if you are not doing well or get worse.   This information is not intended to replace   advice given to you by your health care provider. Make sure you discuss any questions you have with your health care provider.   Document Released: 09/14/2010 Document Revised: 11/06/2012 Document Reviewed: 07/07/2012 Elsevier Interactive Patient Education 2016 Elsevier Inc.  Levonorgestrel intrauterine device (IUD) What is this medicine? LEVONORGESTREL IUD (LEE voe nor jes trel) is a contraceptive (birth control) device. The device is placed inside the uterus by a healthcare professional. It is used to prevent pregnancy and can also be used to treat heavy bleeding  that occurs during your period. Depending on the device, it can be used for 3 to 5 years. This medicine may be used for other purposes; ask your health care provider or pharmacist if you have questions. What should I tell my health care provider before I take this medicine? They need to know if you have any of these conditions: -abnormal Pap smear -cancer of the breast, uterus, or cervix -diabetes -endometritis -genital or pelvic infection now or in the past -have more than one sexual partner or your partner has more than one partner -heart disease -history of an ectopic or tubal pregnancy -immune system problems -IUD in place -liver disease or tumor -problems with blood clots or take blood-thinners -use intravenous drugs -uterus of unusual shape -vaginal bleeding that has not been explained -an unusual or allergic reaction to levonorgestrel, other hormones, silicone, or polyethylene, medicines, foods, dyes, or preservatives -pregnant or trying to get pregnant -breast-feeding How should I use this medicine? This device is placed inside the uterus by a health care professional. Talk to your pediatrician regarding the use of this medicine in children. Special care may be needed. Overdosage: If you think you have taken too much of this medicine contact a poison control center or emergency room at once. NOTE: This medicine is only for you. Do not share this medicine with others. What if I miss a dose? This does not apply. What may interact with this medicine? Do not take this medicine with any of the following medications: -amprenavir -bosentan -fosamprenavir This medicine may also interact with the following medications: -aprepitant -barbiturate medicines for inducing sleep or treating seizures -bexarotene -griseofulvin -medicines to treat seizures like carbamazepine, ethotoin, felbamate, oxcarbazepine, phenytoin,  topiramate -modafinil -pioglitazone -rifabutin -rifampin -rifapentine -some medicines to treat HIV infection like atazanavir, indinavir, lopinavir, nelfinavir, tipranavir, ritonavir -St. John's wort -warfarin This list may not describe all possible interactions. Give your health care provider a list of all the medicines, herbs, non-prescription drugs, or dietary supplements you use. Also tell them if you smoke, drink alcohol, or use illegal drugs. Some items may interact with your medicine. What should I watch for while using this medicine? Visit your doctor or health care professional for regular check ups. See your doctor if you or your partner has sexual contact with others, becomes HIV positive, or gets a sexual transmitted disease. This product does not protect you against HIV infection (AIDS) or other sexually transmitted diseases. You can check the placement of the IUD yourself by reaching up to the top of your vagina with clean fingers to feel the threads. Do not pull on the threads. It is a good habit to check placement after each menstrual period. Call your doctor right away if you feel more of the IUD than just the threads or if you cannot feel the threads at all. The IUD may come out by itself. You may become pregnant if the device comes out. If you notice that the IUD has come out   use a backup birth control method like condoms and call your health care provider. Using tampons will not change the position of the IUD and are okay to use during your period. What side effects may I notice from receiving this medicine? Side effects that you should report to your doctor or health care professional as soon as possible: -allergic reactions like skin rash, itching or hives, swelling of the face, lips, or tongue -fever, flu-like symptoms -genital sores -high blood pressure -no menstrual period for 6 weeks during use -pain, swelling, warmth in the leg -pelvic pain or tenderness -severe or  sudden headache -signs of pregnancy -stomach cramping -sudden shortness of breath -trouble with balance, talking, or walking -unusual vaginal bleeding, discharge -yellowing of the eyes or skin Side effects that usually do not require medical attention (report to your doctor or health care professional if they continue or are bothersome): -acne -breast pain -change in sex drive or performance -changes in weight -cramping, dizziness, or faintness while the device is being inserted -headache -irregular menstrual bleeding within first 3 to 6 months of use -nausea This list may not describe all possible side effects. Call your doctor for medical advice about side effects. You may report side effects to FDA at 1-800-FDA-1088. Where should I keep my medicine? This does not apply. NOTE: This sheet is a summary. It may not cover all possible information. If you have questions about this medicine, talk to your doctor, pharmacist, or health care provider.    2016, Elsevier/Gold Standard. (2011-02-16 13:54:04)   

## 2015-11-25 ENCOUNTER — Ambulatory Visit: Payer: BLUE CROSS/BLUE SHIELD | Admitting: Women's Health

## 2015-12-24 ENCOUNTER — Encounter (HOSPITAL_COMMUNITY): Payer: Self-pay | Admitting: *Deleted

## 2015-12-24 ENCOUNTER — Emergency Department (HOSPITAL_COMMUNITY)
Admission: EM | Admit: 2015-12-24 | Discharge: 2015-12-24 | Disposition: A | Discharge status: Home / Self Care | Payer: BLUE CROSS/BLUE SHIELD | Attending: Emergency Medicine | Admitting: Emergency Medicine

## 2015-12-24 DIAGNOSIS — Z79899 Other long term (current) drug therapy: Secondary | ICD-10-CM | POA: Insufficient documentation

## 2015-12-24 DIAGNOSIS — R222 Localized swelling, mass and lump, trunk: Secondary | ICD-10-CM | POA: Insufficient documentation

## 2015-12-24 DIAGNOSIS — R229 Localized swelling, mass and lump, unspecified: Secondary | ICD-10-CM

## 2015-12-24 LAB — POC URINE PREG, ED: PREG TEST UR: NEGATIVE

## 2015-12-24 MED ORDER — TRAMADOL HCL 50 MG PO TABS: 100.0000 mg | ORAL_TABLET | Freq: Four times a day (QID) | ORAL | 0 refills | Status: DC | PRN

## 2015-12-24 MED ORDER — SULFAMETHOXAZOLE-TRIMETHOPRIM 800-160 MG PO TABS: 1.0000 | ORAL_TABLET | Freq: Two times a day (BID) | ORAL | 0 refills | Status: DC

## 2015-12-24 MED ORDER — LIDOCAINE-EPINEPHRINE (PF) 1 %-1:200000 IJ SOLN
INTRAMUSCULAR | Status: AC
  Administered 2015-12-24: 10 mL
  Filled 2015-12-24: qty 30

## 2015-12-24 MED ORDER — IBUPROFEN 400 MG PO TABS
600.0000 mg | ORAL_TABLET | Freq: Once | ORAL | Status: AC
  Administered 2015-12-24: 600 mg via ORAL
  Filled 2015-12-24: qty 2

## 2015-12-24 MED ORDER — SULFAMETHOXAZOLE-TRIMETHOPRIM 800-160 MG PO TABS
1.0000 | ORAL_TABLET | Freq: Once | ORAL | Status: AC
  Administered 2015-12-24: 1 via ORAL
  Filled 2015-12-24: qty 1

## 2015-12-24 MED ORDER — ACETAMINOPHEN 325 MG PO TABS
650.0000 mg | ORAL_TABLET | Freq: Once | ORAL | Status: AC
  Administered 2015-12-24: 650 mg via ORAL
  Filled 2015-12-24: qty 2

## 2015-12-24 MED ORDER — TRAMADOL HCL 50 MG PO TABS
100.0000 mg | ORAL_TABLET | Freq: Once | ORAL | Status: AC
  Administered 2015-12-24: 100 mg via ORAL
  Filled 2015-12-24: qty 2

## 2015-12-24 MED ORDER — LIDOCAINE-EPINEPHRINE (PF) 1 %-1:200000 IJ SOLN
10.0000 mL | Freq: Once | INTRAMUSCULAR | Status: AC
  Administered 2015-12-24: 10 mL

## 2015-12-24 NOTE — ED Provider Notes (Signed)
[facility]PT Provider Note   CSN: [id number] Arrival date & time: 12/24/15  0202  Time seen 03:00 AM   History   Chief Complaint Chief Complaint  Patient presents with  . Abscess    HPI Nancy Russell is a 25 y.o. female.  HPI patient reports about 10 years ago she had a cyst removed from her left upper chest. She reports about 2 weeks ago she started noticing a not returning in the same area that is getting progressively larger and painful. She denies any draining. She denies any fever. She denies any known injury or trauma.  PCP Dr. Selena Batten  Past Medical History:  Diagnosis Date  . Headache(784.0)   . 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 Nexplanon in place 02/12/2015  . Nexplanon removal 02/26/2015  . Vaginal Pap smear, abnormal     Patient Active Problem List   Diagnosis Date Noted  . Encounter for insertion of mirena IUD 10/26/2015  . 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 Abnormal liver function 06/13/2015  . Hyponatremia 06/13/2015  . Hypokalemia 06/13/2015  . Nausea with vomiting   . Nexplanon removal 02/26/2015    Past Surgical History:  Procedure Laterality Date  . ABDOMINAL SURGERY     gastrectomy  . CESAREAN SECTION  01/05/2012   Procedure: CESAREAN SECTION;  Surgeon: Lazaro Arms, MD;  Location: [facility];  Service: Obstetrics;  Laterality: N/A;  . CESAREAN SECTION    . CYSTECTOMY  at age 33   cyst removed from my chest   . ESOPHAGOGASTRODUODENOSCOPY  03/15/2015  . STOMACH SURGERY      OB History    Gravida Para Term Preterm AB Living   1 1 0 1 0 2   SAB TAB Ectopic Multiple Live Births   0 0 0 1 2       Home Medications    None per patient  Prior to Admission medications   Medication Sig Start Date End Date Taking? Authorizing Provider  cephALEXin (KEFLEX) 500 MG capsule Take 500 mg by mouth 2 (two) times daily. 5 day course starting on 06/06/2015 06/06/15   Historical Provider, MD  gabapentin (NEURONTIN) 250 MG/5ML solution  Take 6 mLs by mouth every 8 (eight) hours. For nausea 06/01/15   Historical Provider, MD  potassium chloride (KLOR-CON) 20 MEQ packet Take 40 mEq by mouth 2 (two) times daily. Patient not taking: Reported on 10/26/2015 06/13/15   Standley Brooking, MD  sulfamethoxazole-trimethoprim (BACTRIM DS,SEPTRA DS) 800-160 MG tablet Take 1 tablet by mouth 2 (two) times daily. 12/24/15   Devoria Albe, MD  traMADol (ULTRAM) 50 MG tablet Take 2 tablets (100 mg total) by mouth every 6 (six) hours as needed for severe pain. 12/24/15   Devoria Albe, MD    Family History Family History  Problem Relation Age of Onset  . Anesthesia problems Neg Hx   . Hypotension Neg Hx   . Malignant hyperthermia Neg Hx   . Pseudochol deficiency Neg Hx   . Other Neg Hx     Social History Social History  Substance Use Topics  . Smoking status: Never Smoker  . Smokeless tobacco: Never Used  . Alcohol use No     Allergies   Patient has no known allergies.   Review of Systems Review of Systems  All other systems reviewed and are negative.    Physical Exam Updated Vital Signs BP 117/75 (BP Location: Left Arm)   Pulse 64  Temp 98.5 F (36.9 C) (Oral)   Resp 20   Ht 5\' 4"  (1.626 m)   Wt 220 lb (99.8 kg)   LMP 12/24/2015   SpO2 99%   BMI 37.76 kg/m   Vital signs normal     Physical Exam  Constitutional: She is oriented to person, place, and time. She appears well-developed and well-nourished.  Non-toxic appearance. She does not appear ill. No distress.  HENT:  Head: Normocephalic and atraumatic.  Right Ear: External ear normal.  Left Ear: External ear normal.  Nose: Nose normal.  Mouth/Throat: Mucous membranes are normal.  Eyes: Conjunctivae and EOM are normal.  Neck: Normal range of motion and full passive range of motion without pain.  Cardiovascular: Normal rate.   Pulmonary/Chest: Effort normal. No respiratory distress. She has no rhonchi. She exhibits no crepitus.  Abdominal: Normal appearance. She  exhibits no distension.  Musculoskeletal: Normal range of motion. She exhibits no edema or tenderness.  Moves all extremities well.   Neurological: She is alert and oriented to person, place, and time. She has normal strength. No cranial nerve deficit.  Skin: Skin is warm, dry and intact. No rash noted. No erythema. No pallor.  Patient has a old healed linear scar in her left upper chest from her prior cyst removal. Underneath it is a freely mobile round firm feeling subcutaneous cyst or mass. The skin overlying the area is not red or inflamed. There is no increased warmth. The size is approximately 1-1/2 cm.  Psychiatric: She has a normal mood and affect. Her speech is normal and behavior is normal. Her mood appears not anxious.  Nursing note and vitals reviewed.    ED Treatments / Results  Labs (all labs ordered are listed, but only abnormal results are displayed) Results for orders placed or performed during the hospital encounter of 12/24/15  POC urine preg, ED  Result Value Ref Range   Preg Test, Ur NEGATIVE NEGATIVE   Laboratory interpretation all normal      Procedures Procedures (including critical care time)  Medications Ordered in ED Medications  lidocaine-EPINEPHrine (XYLOCAINE-EPINEPHrine) 1 %-1:200000 (PF) injection 10 mL (10 mLs Infiltration Given by Other 12/24/15 0310)  traMADol (ULTRAM) tablet 100 mg (100 mg Oral Given 12/24/15 0336)  ibuprofen (ADVIL,MOTRIN) tablet 600 mg (600 mg Oral Given 12/24/15 0335)  acetaminophen (TYLENOL) tablet 650 mg (650 mg Oral Given 12/24/15 0336)  sulfamethoxazole-trimethoprim (BACTRIM DS,SEPTRA DS) 800-160 MG per tablet 1 tablet (1 tablet Oral Given 12/24/15 0336)     Initial Impression / Assessment and Plan / ED Course  I have reviewed the triage vital signs and the nursing notes.  Pertinent labs & imaging results that were available during my care of the patient were reviewed by me and considered in my medical decision making  (see chart for details).  Clinical Course     The patient's skin was cleaned with chlorhexidine. I infiltrated approximately 1 mL of lidocaine 1% with 1% Xylocaine. I attempted to insert an 18-gauge needle into the presumed cyst however it is extremely hard and the needle will not penetrate it. Patient was given pain medications prior to leaving the ED. She was started on Septra DS other like do not believe this is an abscess or infection. She'll be referred to surgery for definitive removal of the mass.  Final Clinical Impressions(s) / ED Diagnoses   Final diagnoses:  Subcutaneous mass    New Prescriptions New Prescriptions   SULFAMETHOXAZOLE-TRIMETHOPRIM (BACTRIM DS,SEPTRA DS) 800-160 MG TABLET  Take 1 tablet by mouth 2 (two) times daily.   TRAMADOL (ULTRAM) 50 MG TABLET    Take 2 tablets (100 mg total) by mouth every 6 (six) hours as needed for severe pain.    Plan discharge  Devoria AlbeIva Melik Blancett, MD, Concha PyoFACEP    Mirl Hillery, MD 12/24/15 [phone]

## 2015-12-24 NOTE — ED Triage Notes (Signed)
Pt states she has something growing on her chest. Had a cyst removed from same spot years ago. Pt says more painful now. Has not talked to her PCP.

## 2015-12-24 NOTE — Discharge Instructions (Signed)
Try ice and heat over the area for comfort. Take the antibiotic. Take ibuprofen 600 mg + acetaminophen 1000 mg 4 times a day for pain. Add the tramadol for pain not controlled by the ibuprofen and acetaminophen. Call Dr Jinny Sandersavis's office to get an appointment to have him remove the mass.  Return to the ED for fever, redness of the skin.

## 2016-01-07 NOTE — H&P (Signed)
  NTS SOAP Note  Vital Signs:  Vitals as of: 01/06/2016: Systolic 127: Diastolic 72: Heart Rate 60: Temp 98.24F (Temporal): Height 705ft 5in: Weight 206Lbs 0 Ounces: BMI 34.28   BMI : 34.28 kg/m2  Subjective: This 25 year old female presents for of an enlarging sebaceous cyst on her chest wall.  Referred by the ER.  Has had it excised in the past, but is recurring.  Has been enlarging recently and is tender to touch.  No drainage noted.   No fevers noted.  Review of Symptoms:  Constitutional:negative Head:negative Eyes:negative Nose/Mouth/Throat:negative Cardiovascular:negative Respiratory:negative Gastrointestinnegative Genitourinary:negative Musculoskeletal:negative as above Hematolgic/Lymphatic:negative Allergic/Immunologic:negative   Past Medical History:Reviewed  Past Medical History  Surgical History: cyst removal, gastric surgery Medical Problems: none Allergies: nkda Medications: none   Social History:Reviewed  Social History  Preferred Language: English Race:  White Ethnicity: Not Hispanic / Latino Age: [age] year Marital Status:  S Alcohol: no   Smoking Status: Never smoker reviewed on 01/06/2016 Functional Status reviewed on 01/06/2016 ------------------------------------------------ Bathing: Normal Cooking: Normal Dressing: Normal Driving: Normal Eating: Normal Managing Meds: Normal Oral Care: Normal Shopping: Normal Toileting: Normal Transferring: Normal Walking: Normal Cognitive Status reviewed on 01/06/2016 ------------------------------------------------ Attention: Normal Decision Making: Normal Language: Normal Memory: Normal Motor: Normal Perception: Normal Problem Solving: Normal Visual and Spatial: Normal   Family History:Reviewed  Family Health History Mother, Living; Healthy;  Father, Living; Healthy;     Objective Information: General:Well appearing, well nourished in no distress. 3.2cm tender  cyst in anterior left upper chest, no erythema.  No drainage noted.  Several anchor piercings noted along midline. Head:Atraumatic; no masses; no abnormalities Neck:Supple without lymphadenopathy.  Heart:RRR, no murmur or gallop.  Normal S1, S2.  No S3, S4.  Lungs:CTA bilaterally, no wheezes, rhonchi, rales.  Breathing unlabored. ER notes reviewed Assessment:Sebaceous cyst, chest wall  Diagnoses: 706.2  L72.3 Epidermoid cyst of skin (Sebaceous cyst)  Procedures: [id number] - OFFICE OUTPATIENT NEW 30 MINUTES    Plan:  Scheduled for excision of sebaceous cyst, chest wall on 01/17/16.   Patient Education:Alternative treatments to surgery were discussed with patient (and family).Risks and benefits  of procedure including bleeding, infection, and recurrence of the cyst were fully explained to the patient (and family) who gave informed consent. Patient/family questions were addressed.  Follow-up:Pending Surgery

## 2016-01-11 NOTE — Patient Instructions (Signed)
Nancy Russell  01/11/2016     @PREFPERIOPPHARMACY @   Your procedure is scheduled on  01/17/2016   [facility]  [facility].M.  Call this number if you have problems the morning of surgery:  [phone]   Remember:  Do not eat food or drink liquids after midnight.  Take these medicines the morning of surgery with A SIP OF WATER  Ultram.   Do not wear jewelry, make-up or nail polish.  Do not wear lotions, powders, or perfumes, or deoderant.  Do not shave 48 hours prior to surgery.  Men may shave face and neck.  Do not bring valuables to the hospital.  [facility] for any belongings or valuables.  Contacts, dentures or bridgework may not be worn into surgery.  Leave your suitcase in the car.  After surgery it may be brought to your room.  For patients admitted to the hospital, discharge time will be determined by your treatment team.  Patients discharged the day of surgery will not be allowed to drive home.   Name and phone number of your driver:   family Special instructions:  none  Please read over the following fact sheets that you were given. Anesthesia Post-op Instructions and Care and Recovery After Surgery       Epidermal Cyst An epidermal cyst is usually a small, painless lump under the skin. Cysts often occur on the face, neck, stomach, chest, or genitals. The cyst may be filled with a bad smelling paste. Do not pop your cyst. Popping the cyst can cause pain and puffiness (swelling). HOME CARE   Only take medicines as told by your doctor.  Take your medicine (antibiotics) as told. Finish it even if you start to feel better. GET HELP RIGHT AWAY IF:  Your cyst is tender, red, or puffy.  You are not getting better, or you are getting worse.  You have any questions or concerns. MAKE SURE YOU:  Understand these instructions.  Will watch your condition.  Will get help right away if you are not doing well or get  worse. This information is not intended to replace advice given to you by your health care provider. Make sure you discuss any questions you have with your health care provider. Document Released: 02/24/2004 Document Revised: 07/18/2011 Document Reviewed: 11/18/2014 Elsevier Interactive Patient Education  2017 [employer] Epidermal Cyst Removal, Care After Introduction Refer to this sheet in the next few weeks. These instructions provide you with information about caring for yourself after your procedure. Your health care provider may also give you more specific instructions. Your treatment has been planned according to current medical practices, but problems sometimes occur. Call your health care provider if you have any problems or questions after your procedure. What can I expect after the procedure? After the procedure, it is common to have:  Soreness in the area where your cyst was removed.  Tightness or itching from your skin sutures. Follow these instructions at home:  Take medicines only as directed by your health care provider.  If you were prescribed an antibiotic medicine, finish all of it even if you start to feel better.  Use antibiotic ointment as directed by your health care provider. Follow the instructions carefully.  There are many different ways to close and cover an incision, including stitches (sutures), skin glue, and adhesive strips. Follow your health care provider's instructions about:  Incision care.  Bandage (dressing) changes and removal.  Incision closure removal.  Keep the bandage (dressing) dry until your health care provider says that it can be removed. Take sponge baths only. Ask your health care provider when you can start showering or taking a bath.  After your dressing is off, check your incision every day for signs of infection. Watch for:  Redness, swelling, or pain.  Fluid, blood, or pus.  You can return to your normal activities. Do not  do anything that stretches or puts pressure on your incision.  You can return to your normal diet.  Keep all follow-up visits as directed by your health care provider. This is important. Contact a health care provider if:  You have a fever.  Your incision bleeds.  You have redness, swelling, or pain in the incision area.  You have fluid, blood, or pus coming from your incision.  Your cyst comes back after surgery. This information is not intended to replace advice given to you by your health care provider. Make sure you discuss any questions you have with your health care provider. Document Released: 02/06/2014 Document Revised: 06/24/2015 Document Reviewed: 10/01/2013  2017 Elsevier PATIENT INSTRUCTIONS POST-ANESTHESIA  IMMEDIATELY FOLLOWING SURGERY:  Do not drive or operate machinery for the first twenty four hours after surgery.  Do not make any important decisions for twenty four hours after surgery or while taking narcotic pain medications or sedatives.  If you develop intractable nausea and vomiting or a severe headache please notify your doctor immediately.  FOLLOW-UP:  Please make an appointment with your surgeon as instructed. You do not need to follow up with anesthesia unless specifically instructed to do so.  WOUND CARE INSTRUCTIONS (if applicable):  Keep a dry clean dressing on the anesthesia/puncture wound site if there is drainage.  Once the wound has quit draining you may leave it open to air.  Generally you should leave the bandage intact for twenty four hours unless there is drainage.  If the epidural site drains for more than 36-48 hours please call the anesthesia department.  QUESTIONS?:  Please feel free to call your physician or the hospital operator if you have any questions, and they will be happy to assist you.

## 2016-01-12 ENCOUNTER — Encounter (HOSPITAL_COMMUNITY)
Admission: RE | Admit: 2016-01-12 | Discharge: 2016-01-12 | Disposition: A | Discharge status: Home / Self Care | Payer: BLUE CROSS/BLUE SHIELD | Source: Ambulatory Visit | Attending: General Surgery | Admitting: General Surgery

## 2016-01-12 ENCOUNTER — Encounter (HOSPITAL_COMMUNITY): Payer: Self-pay

## 2016-01-12 DIAGNOSIS — L723 Sebaceous cyst: Secondary | ICD-10-CM | POA: Diagnosis not present

## 2016-01-12 DIAGNOSIS — Z01812 Encounter for preprocedural laboratory examination: Secondary | ICD-10-CM | POA: Diagnosis not present

## 2016-01-12 LAB — CBC WITH DIFFERENTIAL/PLATELET
BASOS ABS: 0 10*3/uL (ref 0.0–0.1)
BASOS PCT: 0 %
EOS ABS: 0.1 10*3/uL (ref 0.0–0.7)
EOS PCT: 1 %
HCT: 41 % (ref 36.0–46.0)
Hemoglobin: 13.7 g/dL (ref 12.0–15.0)
Lymphocytes Relative: 39 %
Lymphs Abs: 2.8 10*3/uL (ref 0.7–4.0)
MCH: 30.5 pg (ref 26.0–34.0)
MCHC: 33.4 g/dL (ref 30.0–36.0)
MCV: 91.3 fL (ref 78.0–100.0)
Monocytes Absolute: 0.3 10*3/uL (ref 0.1–1.0)
Monocytes Relative: 5 %
Neutro Abs: 3.9 10*3/uL (ref 1.7–7.7)
Neutrophils Relative %: 55 %
PLATELETS: 294 10*3/uL (ref 150–400)
RBC: 4.49 MIL/uL (ref 3.87–5.11)
RDW: 13.2 % (ref 11.5–15.5)
WBC: 7.1 10*3/uL (ref 4.0–10.5)

## 2016-01-12 LAB — BASIC METABOLIC PANEL
Anion gap: 7 (ref 5–15)
BUN: 12 mg/dL (ref 6–20)
CO2: 24 mmol/L (ref 22–32)
CREATININE: 0.9 mg/dL (ref 0.44–1.00)
Calcium: 8.8 mg/dL — ABNORMAL LOW (ref 8.9–10.3)
Chloride: 106 mmol/L (ref 101–111)
Glucose, Bld: 109 mg/dL — ABNORMAL HIGH (ref 65–99)
Potassium: 4 mmol/L (ref 3.5–5.1)
SODIUM: 137 mmol/L (ref 135–145)

## 2016-01-12 LAB — HCG, SERUM, QUALITATIVE: PREG SERUM: NEGATIVE

## 2016-01-17 ENCOUNTER — Ambulatory Visit (HOSPITAL_COMMUNITY): Payer: BLUE CROSS/BLUE SHIELD | Admitting: Anesthesiology

## 2016-01-17 ENCOUNTER — Ambulatory Visit (HOSPITAL_COMMUNITY)
Admission: RE | Admit: 2016-01-17 | Discharge: 2016-01-17 | Disposition: A | Discharge status: Home / Self Care | Payer: BLUE CROSS/BLUE SHIELD | Source: Ambulatory Visit | Attending: General Surgery | Admitting: General Surgery

## 2016-01-17 ENCOUNTER — Encounter (HOSPITAL_COMMUNITY): Payer: Self-pay | Admitting: *Deleted

## 2016-01-17 ENCOUNTER — Encounter (HOSPITAL_COMMUNITY)
Admission: RE | Disposition: A | Discharge status: Home / Self Care | Payer: Self-pay | Source: Ambulatory Visit | Attending: General Surgery

## 2016-01-17 DIAGNOSIS — R222 Localized swelling, mass and lump, trunk: Secondary | ICD-10-CM | POA: Diagnosis present

## 2016-01-17 DIAGNOSIS — L720 Epidermal cyst: Secondary | ICD-10-CM | POA: Diagnosis not present

## 2016-01-17 HISTORY — PX: CYST EXCISION: SHX5701

## 2016-01-17 SURGERY — CYST REMOVAL
Anesthesia: General | Site: Chest

## 2016-01-17 MED ORDER — KETOROLAC TROMETHAMINE 30 MG/ML IJ SOLN
30.0000 mg | Freq: Once | INTRAMUSCULAR | Status: AC
  Administered 2016-01-17: 30 mg via INTRAVENOUS

## 2016-01-17 MED ORDER — POVIDONE-IODINE 10 % EX OINT
TOPICAL_OINTMENT | CUTANEOUS | Status: AC
  Filled 2016-01-17: qty 1

## 2016-01-17 MED ORDER — CEFAZOLIN SODIUM-DEXTROSE 2-4 GM/100ML-% IV SOLN
2.0000 g | INTRAVENOUS | Status: AC
  Administered 2016-01-17: 2 g via INTRAVENOUS
  Filled 2016-01-17: qty 100

## 2016-01-17 MED ORDER — TRAMADOL HCL 50 MG PO TABS: 100.0000 mg | ORAL_TABLET | Freq: Four times a day (QID) | ORAL | 0 refills | Status: DC | PRN

## 2016-01-17 MED ORDER — MIDAZOLAM HCL 2 MG/2ML IJ SOLN
INTRAMUSCULAR | Status: AC
  Filled 2016-01-17: qty 2

## 2016-01-17 MED ORDER — FENTANYL CITRATE (PF) 100 MCG/2ML IJ SOLN
25.0000 ug | INTRAMUSCULAR | Status: AC | PRN
  Administered 2016-01-17 (×2): 25 ug via INTRAVENOUS

## 2016-01-17 MED ORDER — KETOROLAC TROMETHAMINE 30 MG/ML IJ SOLN
INTRAMUSCULAR | Status: AC
  Filled 2016-01-17: qty 1

## 2016-01-17 MED ORDER — LIDOCAINE HCL (CARDIAC) 10 MG/ML IV SOLN
INTRAVENOUS | Status: DC | PRN
  Administered 2016-01-17: 50 mg via INTRAVENOUS

## 2016-01-17 MED ORDER — 0.9 % SODIUM CHLORIDE (POUR BTL) OPTIME
TOPICAL | Status: DC | PRN
  Administered 2016-01-17: 1000 mL

## 2016-01-17 MED ORDER — PROPOFOL 10 MG/ML IV BOLUS
INTRAVENOUS | Status: AC
  Filled 2016-01-17: qty 40

## 2016-01-17 MED ORDER — ONDANSETRON HCL 4 MG/2ML IJ SOLN
4.0000 mg | Freq: Once | INTRAMUSCULAR | Status: AC
  Administered 2016-01-17: 4 mg via INTRAVENOUS
  Filled 2016-01-17: qty 2

## 2016-01-17 MED ORDER — FENTANYL CITRATE (PF) 100 MCG/2ML IJ SOLN
25.0000 ug | INTRAMUSCULAR | Status: DC | PRN
  Administered 2016-01-17: 25 ug via INTRAVENOUS
  Filled 2016-01-17: qty 2

## 2016-01-17 MED ORDER — MIDAZOLAM HCL 2 MG/2ML IJ SOLN
1.0000 mg | INTRAMUSCULAR | Status: DC | PRN
  Administered 2016-01-17: 2 mg via INTRAVENOUS

## 2016-01-17 MED ORDER — CHLORHEXIDINE GLUCONATE CLOTH 2 % EX PADS: 6.0000 | MEDICATED_PAD | Freq: Once | CUTANEOUS | Status: DC

## 2016-01-17 MED ORDER — FENTANYL CITRATE (PF) 100 MCG/2ML IJ SOLN
INTRAMUSCULAR | Status: AC
  Filled 2016-01-17: qty 2

## 2016-01-17 MED ORDER — BUPIVACAINE HCL (PF) 0.5 % IJ SOLN
INTRAMUSCULAR | Status: AC
  Filled 2016-01-17: qty 30

## 2016-01-17 MED ORDER — BUPIVACAINE HCL 0.5 % IJ SOLN
INTRAMUSCULAR | Status: DC | PRN
  Administered 2016-01-17: 7 mL

## 2016-01-17 MED ORDER — FENTANYL CITRATE (PF) 100 MCG/2ML IJ SOLN
INTRAMUSCULAR | Status: DC | PRN
  Administered 2016-01-17 (×2): 25 ug via INTRAVENOUS
  Administered 2016-01-17: 50 ug via INTRAVENOUS

## 2016-01-17 MED ORDER — PROPOFOL 10 MG/ML IV BOLUS
INTRAVENOUS | Status: DC | PRN
  Administered 2016-01-17: 150 mg via INTRAVENOUS
  Administered 2016-01-17: 50 mg via INTRAVENOUS

## 2016-01-17 MED ORDER — LACTATED RINGERS IV SOLN
INTRAVENOUS | Status: DC
  Administered 2016-01-17: 07:00:00 via INTRAVENOUS

## 2016-01-17 SURGICAL SUPPLY — 25 items
ADH SKN CLS APL DERMABOND .7 (GAUZE/BANDAGES/DRESSINGS) ×1
BAG HAMPER (MISCELLANEOUS) ×2 IMPLANT
CLOTH BEACON ORANGE TIMEOUT ST (SAFETY) ×2 IMPLANT
COVER LIGHT HANDLE STERIS (MISCELLANEOUS) ×4 IMPLANT
DECANTER SPIKE VIAL GLASS SM (MISCELLANEOUS) ×2 IMPLANT
DERMABOND ADVANCED (GAUZE/BANDAGES/DRESSINGS) ×1
DERMABOND ADVANCED .7 DNX12 (GAUZE/BANDAGES/DRESSINGS) ×1 IMPLANT
ELECT REM PT RETURN 9FT ADLT (ELECTROSURGICAL) ×2
ELECTRODE REM PT RTRN 9FT ADLT (ELECTROSURGICAL) ×1 IMPLANT
FORMALIN 10 PREFIL 120ML (MISCELLANEOUS) ×2 IMPLANT
GLOVE BIOGEL PI IND STRL 7.0 (GLOVE) ×1 IMPLANT
GLOVE BIOGEL PI INDICATOR 7.0 (GLOVE) ×1
GLOVE ECLIPSE 6.5 STRL STRAW (GLOVE) ×2 IMPLANT
GLOVE EXAM NITRILE MD LF STRL (GLOVE) ×2 IMPLANT
GLOVE SURG SS PI 7.5 STRL IVOR (GLOVE) ×4 IMPLANT
GOWN STRL REUS W/TWL LRG LVL3 (GOWN DISPOSABLE) ×4 IMPLANT
KIT ROOM TURNOVER APOR (KITS) ×2 IMPLANT
MANIFOLD NEPTUNE II (INSTRUMENTS) ×2 IMPLANT
NEEDLE HYPO 25X1 1.5 SAFETY (NEEDLE) ×2 IMPLANT
NS IRRIG 1000ML POUR BTL (IV SOLUTION) ×2 IMPLANT
PACK MINOR (CUSTOM PROCEDURE TRAY) ×2 IMPLANT
PAD ARMBOARD 7.5X6 YLW CONV (MISCELLANEOUS) ×2 IMPLANT
SET BASIN LINEN APH (SET/KITS/TRAYS/PACK) ×2 IMPLANT
SUT VIC AB 4-0 PS2 27 (SUTURE) ×4 IMPLANT
SYR CONTROL 10ML LL (SYRINGE) ×2 IMPLANT

## 2016-01-17 NOTE — Discharge Instructions (Signed)
Epidermal Cyst Removal, Care After °Introduction °Refer to this sheet in the next few weeks. These instructions provide you with information about caring for yourself after your procedure. Your health care provider may also give you more specific instructions. Your treatment has been planned according to current medical practices, but problems sometimes occur. Call your health care provider if you have any problems or questions after your procedure. °What can I expect after the procedure? °After the procedure, it is common to have: °· Soreness in the area where your cyst was removed. °· Tightness or itching from your skin sutures. °Follow these instructions at home: °· Take medicines only as directed by your health care provider. °· If you were prescribed an antibiotic medicine, finish all of it even if you start to feel better. °· Use antibiotic ointment as directed by your health care provider. Follow the instructions carefully. °· There are many different ways to close and cover an incision, including stitches (sutures), skin glue, and adhesive strips. Follow your health care provider's instructions about: °¨ Incision care. °¨ Bandage (dressing) changes and removal. °¨ Incision closure removal. °· Keep the bandage (dressing) dry until your health care provider says that it can be removed. Take sponge baths only. Ask your health care provider when you can start showering or taking a bath. °· After your dressing is off, check your incision every day for signs of infection. Watch for: °¨ Redness, swelling, or pain. °¨ Fluid, blood, or pus. °· You can return to your normal activities. Do not do anything that stretches or puts pressure on your incision. °· You can return to your normal diet. °· Keep all follow-up visits as directed by your health care provider. This is important. °Contact a health care provider if: °· You have a fever. °· Your incision bleeds. °· You have redness, swelling, or pain in the incision  area. °· You have fluid, blood, or pus coming from your incision. °· Your cyst comes back after surgery. °This information is not intended to replace advice given to you by your health care provider. Make sure you discuss any questions you have with your health care provider. °Document Released: 02/06/2014 Document Revised: 06/24/2015 Document Reviewed: 10/01/2013 °© 2017 Elsevier ° °

## 2016-01-17 NOTE — Interval H&P Note (Signed)
History and Physical Interval Note:  01/17/2016 7:24 AM  Nancy Russell  has presented today for surgery, with the diagnosis of sebaceous cyst  The various methods of treatment have been discussed with the patient and family. After consideration of risks, benefits and other options for treatment, the patient has consented to  Procedure(s): EXCISION 3CM CYST ON CHEST WALL (N/A) as a surgical intervention .  The patient's history has been reviewed, patient examined, no change in status, stable for surgery.  I have reviewed the patient's chart and labs.  Questions were answered to the patient's satisfaction.     Franky MachoJENKINS,Brady Schiller A

## 2016-01-17 NOTE — Anesthesia Preprocedure Evaluation (Signed)
Anesthesia Evaluation  Patient identified by MRN, date of birth, ID band Patient awake    Reviewed: Allergy & Precautions, NPO status , Patient's Chart, lab work & pertinent test results  Airway Mallampati: III  TM Distance: >3 FB     Dental  (+) Teeth Intact   Pulmonary neg pulmonary ROS,    breath sounds clear to auscultation       Cardiovascular negative cardio ROS   Rhythm:Regular Rate:Normal     Neuro/Psych  Headaches,    GI/Hepatic negative GI ROS, Gastric sleeve   Endo/Other    Renal/GU      Musculoskeletal   Abdominal   Peds  Hematology   Anesthesia Other Findings   Reproductive/Obstetrics                             Anesthesia Physical Anesthesia Plan  ASA: II  Anesthesia Plan: General   Post-op Pain Management:    Induction: Intravenous  Airway Management Planned: LMA  Additional Equipment:   Intra-op Plan:   Post-operative Plan: Extubation in OR  Informed Consent: I have reviewed the patients History and Physical, chart, labs and discussed the procedure including the risks, benefits and alternatives for the proposed anesthesia with the patient or authorized representative who has indicated his/her understanding and acceptance.     Plan Discussed with:   Anesthesia Plan Comments:         Anesthesia Quick Evaluation

## 2016-01-17 NOTE — Anesthesia Postprocedure Evaluation (Signed)
Anesthesia Post Note  Patient: Nancy Russell  Procedure(s) Performed: Procedure(s) (LRB): EXCISION 3CM CYST ON CHEST WALL (N/A)  Patient location during evaluation: PACU Anesthesia Type: MAC Level of consciousness: awake Pain management: satisfactory to patient Vital Signs Assessment: post-procedure vital signs reviewed and stable Respiratory status: spontaneous breathing Cardiovascular status: stable Anesthetic complications: no    Last Vitals:  Vitals:   01/17/16 0900 01/17/16 0927  BP: 119/77 112/75  Pulse: (!) 51 (!) 57  Resp: (!) 8 16  Temp:  36.3 C    Last Pain:  Vitals:   01/17/16 0927  TempSrc: Oral  PainSc:                  [name],[name]

## 2016-01-17 NOTE — Transfer of Care (Signed)
Immediate Anesthesia Transfer of Care Note  Patient: Nancy Russell  Procedure(s) Performed: Procedure(s): EXCISION 3CM CYST ON CHEST WALL (N/A)  Patient Location: PACU  Anesthesia Type:General  Level of Consciousness: awake and patient cooperative  Airway & Oxygen Therapy: Patient Spontanous Breathing and non-rebreather face mask  Post-op Assessment: Report given to RN and Post -op Vital signs reviewed and stable  Post vital signs: Reviewed and stable  Last Vitals:  Vitals:   01/17/16 0726 01/17/16 0728  BP:  122/60  Pulse:    Resp: (!) 91 (!) 47  Temp:      Last Pain:  Vitals:   01/17/16 0641  TempSrc: Oral  PainSc: 9       Patients Stated Pain Goal: 10 (01/17/16 0641)  Complications: No apparent anesthesia complications

## 2016-01-17 NOTE — Anesthesia Procedure Notes (Signed)
Procedure Name: LMA Insertion Date/Time: 01/17/2016 7:38 AM Performed by: Franco NonesYATES, Emmit Oriley S Pre-anesthesia Checklist: Patient identified, Patient being monitored, Emergency Drugs available, Timeout performed and Suction available Patient Re-evaluated:Patient Re-evaluated prior to inductionOxygen Delivery Method: Circle System Utilized Preoxygenation: Pre-oxygenation with 100% oxygen Intubation Type: IV induction Ventilation: Mask ventilation without difficulty LMA: LMA inserted LMA Size: 3.0 Number of attempts: 1 Placement Confirmation: positive ETCO2 and breath sounds checked- equal and bilateral Tube secured with: Tape Dental Injury: Teeth and Oropharynx as per pre-operative assessment

## 2016-01-17 NOTE — Op Note (Signed)
Patient:  Nancy Russell  DOB:  1990/08/28  MRN:  [id number]   Preop Diagnosis:  3 cm subcutaneous mass, chest wall  Postop Diagnosis:  Same, sebaceous cyst  Procedure:  Excision of 3 cm sebaceous cyst, chest wall  Surgeon:  Franky MachoMark Iya Hamed, M.D.  Anes:  Gen.  Indications:  Patient is a 25 year old white female presents with an enlarging and tender sebaceous cyst on the chest wall. She has had previous incision of this, but it has recurred. She also has a skin piercing just medial to this, could not be removed. It is somewhat erythematous. The risks and benefits of the procedure including bleeding, infection, and recurrence of the cyst were fully explained to the patient, who gave informed consent.  Procedure note:  The patient was placed the supine position. After general anesthesia was administered, the upper chest was prepped and draped using usual sterile technique with DuraPrep. Surgical site confirmation was performed.  The mass was just to the left of the midline and over the clavicle. Incision was made through the previous surgical scar. The cyst was excised without difficulty. It was sent to pathology further examination. Any bleeding was controlled using Bovie cautery. 0.5% Sensorcaine was instilled into the surrounding wound. The subcutaneous layer was reapproximated using a 4-0 Vicryl subcutaneous suture. The skin was closed using a 4-0 Vicryl subcuticular suture. Dermabond was applied.  All tape and needle counts were correct at the end of the procedure. The patient was awakened and transferred to PACU in stable condition.  Complications:  None  EBL:  Minimal  Specimen:  Sebaceous cyst, chest wall

## 2016-01-18 ENCOUNTER — Encounter (HOSPITAL_COMMUNITY): Payer: Self-pay | Admitting: General Surgery

## 2016-01-19 ENCOUNTER — Emergency Department (HOSPITAL_COMMUNITY): Payer: BLUE CROSS/BLUE SHIELD

## 2016-01-19 ENCOUNTER — Emergency Department (HOSPITAL_COMMUNITY)
Admission: EM | Admit: 2016-01-19 | Discharge: 2016-01-19 | Disposition: A | Discharge status: Home / Self Care | Payer: BLUE CROSS/BLUE SHIELD | Attending: Emergency Medicine | Admitting: Emergency Medicine

## 2016-01-19 ENCOUNTER — Encounter (HOSPITAL_COMMUNITY): Payer: Self-pay | Admitting: *Deleted

## 2016-01-19 DIAGNOSIS — S60211A Contusion of right wrist, initial encounter: Secondary | ICD-10-CM | POA: Diagnosis not present

## 2016-01-19 DIAGNOSIS — Y999 Unspecified external cause status: Secondary | ICD-10-CM | POA: Diagnosis not present

## 2016-01-19 DIAGNOSIS — L03313 Cellulitis of chest wall: Secondary | ICD-10-CM | POA: Insufficient documentation

## 2016-01-19 DIAGNOSIS — R51 Headache: Secondary | ICD-10-CM | POA: Insufficient documentation

## 2016-01-19 DIAGNOSIS — Z79899 Other long term (current) drug therapy: Secondary | ICD-10-CM | POA: Insufficient documentation

## 2016-01-19 DIAGNOSIS — Y9389 Activity, other specified: Secondary | ICD-10-CM | POA: Diagnosis not present

## 2016-01-19 DIAGNOSIS — Y92410 Unspecified street and highway as the place of occurrence of the external cause: Secondary | ICD-10-CM | POA: Insufficient documentation

## 2016-01-19 DIAGNOSIS — S6991XA Unspecified injury of right wrist, hand and finger(s), initial encounter: Secondary | ICD-10-CM | POA: Diagnosis present

## 2016-01-19 MED ORDER — NAPROXEN 500 MG PO TABS: 500.0000 mg | ORAL_TABLET | Freq: Two times a day (BID) | ORAL | 0 refills | Status: DC

## 2016-01-19 MED ORDER — DOXYCYCLINE HYCLATE 100 MG PO CAPS: 100.0000 mg | ORAL_CAPSULE | Freq: Two times a day (BID) | ORAL | 0 refills | Status: DC
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
## 2016-01-19 NOTE — ED Triage Notes (Signed)
Denies any loss of consciousness.
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
## 2016-01-23 ENCOUNTER — Encounter (HOSPITAL_COMMUNITY): Payer: Self-pay

## 2016-01-23 ENCOUNTER — Emergency Department (HOSPITAL_COMMUNITY)
Admission: EM | Admit: 2016-01-23 | Discharge: 2016-01-24 | Disposition: A | Discharge status: Home / Self Care | Payer: BLUE CROSS/BLUE SHIELD | Attending: Emergency Medicine | Admitting: Emergency Medicine

## 2016-01-23 DIAGNOSIS — R222 Localized swelling, mass and lump, trunk: Secondary | ICD-10-CM | POA: Diagnosis present

## 2016-01-23 DIAGNOSIS — L7633 Postprocedural seroma of skin and subcutaneous tissue following a dermatologic procedure: Secondary | ICD-10-CM

## 2016-01-23 DIAGNOSIS — Z79899 Other long term (current) drug therapy: Secondary | ICD-10-CM | POA: Insufficient documentation

## 2016-01-23 DIAGNOSIS — L7682 Other postprocedural complications of skin and subcutaneous tissue: Secondary | ICD-10-CM | POA: Diagnosis not present

## 2016-01-23 NOTE — ED Triage Notes (Signed)
Pt reports having cyst removed on 6 days ago and started having drainage from incision tonight.

## 2016-01-23 NOTE — Discharge Instructions (Signed)
Use ice packs for comfort. Finish the antibiotics. Keep your appointment with Dr Lovell SheehanJenkins. You can speak to him if it is getting worse by calling the office number.
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
## 2016-01-24 NOTE — ED Notes (Signed)
Clean surgical incision on chest and applied non-adherent and dry dressing, patient tolerated well.

## 2016-02-11 ENCOUNTER — Encounter (HOSPITAL_COMMUNITY): Payer: Self-pay | Admitting: Emergency Medicine

## 2016-02-11 ENCOUNTER — Emergency Department (HOSPITAL_COMMUNITY)
Admission: EM | Admit: 2016-02-11 | Discharge: 2016-02-11 | Disposition: A | Discharge status: Home / Self Care | Payer: BLUE CROSS/BLUE SHIELD | Attending: Emergency Medicine | Admitting: Emergency Medicine

## 2016-02-11 DIAGNOSIS — Y999 Unspecified external cause status: Secondary | ICD-10-CM | POA: Diagnosis not present

## 2016-02-11 DIAGNOSIS — Z23 Encounter for immunization: Secondary | ICD-10-CM | POA: Insufficient documentation

## 2016-02-11 DIAGNOSIS — Y9389 Activity, other specified: Secondary | ICD-10-CM | POA: Insufficient documentation

## 2016-02-11 DIAGNOSIS — Y92410 Unspecified street and highway as the place of occurrence of the external cause: Secondary | ICD-10-CM | POA: Diagnosis not present

## 2016-02-11 DIAGNOSIS — Z79899 Other long term (current) drug therapy: Secondary | ICD-10-CM | POA: Insufficient documentation

## 2016-02-11 DIAGNOSIS — S0101XA Laceration without foreign body of scalp, initial encounter: Secondary | ICD-10-CM | POA: Insufficient documentation

## 2016-02-11 MED ORDER — DICLOFENAC SODIUM 50 MG PO TBEC: 50.0000 mg | DELAYED_RELEASE_TABLET | Freq: Two times a day (BID) | ORAL | 0 refills | Status: DC

## 2016-02-11 MED ORDER — METHOCARBAMOL 500 MG PO TABS: 500.0000 mg | ORAL_TABLET | Freq: Two times a day (BID) | ORAL | 0 refills | Status: DC

## 2016-02-11 MED ORDER — IBUPROFEN 400 MG PO TABS
600.0000 mg | ORAL_TABLET | Freq: Once | ORAL | Status: AC
  Administered 2016-02-11: 600 mg via ORAL
  Filled 2016-02-11: qty 2

## 2016-02-11 MED ORDER — TETANUS-DIPHTH-ACELL PERTUSSIS 5-2.5-18.5 LF-MCG/0.5 IM SUSP
0.5000 mL | Freq: Once | INTRAMUSCULAR | Status: AC
  Administered 2016-02-11: 0.5 mL via INTRAMUSCULAR
  Filled 2016-02-11: qty 0.5
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
## 2016-02-17 ENCOUNTER — Encounter (HOSPITAL_COMMUNITY): Payer: Self-pay | Admitting: *Deleted

## 2016-02-17 ENCOUNTER — Emergency Department (HOSPITAL_COMMUNITY)
Admission: EM | Admit: 2016-02-17 | Discharge: 2016-02-17 | Disposition: A | Discharge status: Home / Self Care | Payer: BLUE CROSS/BLUE SHIELD | Attending: Dermatology | Admitting: Dermatology

## 2016-02-17 DIAGNOSIS — Z79899 Other long term (current) drug therapy: Secondary | ICD-10-CM | POA: Insufficient documentation

## 2016-02-17 DIAGNOSIS — Z5321 Procedure and treatment not carried out due to patient leaving prior to being seen by health care provider: Secondary | ICD-10-CM | POA: Insufficient documentation

## 2016-02-17 DIAGNOSIS — Z4802 Encounter for removal of sutures: Secondary | ICD-10-CM | POA: Insufficient documentation

## 2016-02-17 NOTE — ED Triage Notes (Signed)
Pt left prior to being seen by EDP.
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
## 2016-02-19 ENCOUNTER — Emergency Department (HOSPITAL_COMMUNITY)
Admission: EM | Admit: 2016-02-19 | Discharge: 2016-02-19 | Disposition: A | Discharge status: Home / Self Care | Payer: BLUE CROSS/BLUE SHIELD | Attending: Emergency Medicine | Admitting: Emergency Medicine

## 2016-02-19 ENCOUNTER — Encounter (HOSPITAL_COMMUNITY): Payer: Self-pay | Admitting: Emergency Medicine

## 2016-02-19 DIAGNOSIS — Z79899 Other long term (current) drug therapy: Secondary | ICD-10-CM | POA: Insufficient documentation

## 2016-02-19 DIAGNOSIS — Z4802 Encounter for removal of sutures: Secondary | ICD-10-CM | POA: Diagnosis present
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
## 2016-05-24 DIAGNOSIS — M545 Low back pain: Secondary | ICD-10-CM | POA: Diagnosis not present

## 2016-05-24 DIAGNOSIS — Z6836 Body mass index (BMI) 36.0-36.9, adult: Secondary | ICD-10-CM | POA: Diagnosis not present

## 2016-06-05 DIAGNOSIS — Z0000 Encounter for general adult medical examination without abnormal findings: Secondary | ICD-10-CM | POA: Diagnosis not present

## 2016-06-07 DIAGNOSIS — M545 Low back pain: Secondary | ICD-10-CM | POA: Diagnosis not present

## 2016-06-07 DIAGNOSIS — Z951 Presence of aortocoronary bypass graft: Secondary | ICD-10-CM | POA: Diagnosis not present

## 2016-06-07 DIAGNOSIS — Z0000 Encounter for general adult medical examination without abnormal findings: Secondary | ICD-10-CM | POA: Diagnosis not present

## 2016-10-11 DIAGNOSIS — B372 Candidiasis of skin and nail: Secondary | ICD-10-CM | POA: Diagnosis not present

## 2016-10-11 DIAGNOSIS — Z6836 Body mass index (BMI) 36.0-36.9, adult: Secondary | ICD-10-CM | POA: Diagnosis not present

## 2017-01-03 ENCOUNTER — Other Ambulatory Visit: Payer: Self-pay

## 2017-01-03 ENCOUNTER — Emergency Department (HOSPITAL_COMMUNITY): Payer: BLUE CROSS/BLUE SHIELD

## 2017-01-03 ENCOUNTER — Emergency Department (HOSPITAL_COMMUNITY)
Admission: EM | Admit: 2017-01-03 | Discharge: 2017-01-03 | Disposition: A | Discharge status: Home / Self Care | Payer: BLUE CROSS/BLUE SHIELD | Attending: Emergency Medicine | Admitting: Emergency Medicine

## 2017-01-03 ENCOUNTER — Encounter (HOSPITAL_COMMUNITY): Payer: Self-pay | Admitting: Emergency Medicine

## 2017-01-03 DIAGNOSIS — Z79899 Other long term (current) drug therapy: Secondary | ICD-10-CM | POA: Diagnosis not present

## 2017-01-03 DIAGNOSIS — R05 Cough: Secondary | ICD-10-CM | POA: Diagnosis not present

## 2017-01-03 DIAGNOSIS — R509 Fever, unspecified: Secondary | ICD-10-CM | POA: Diagnosis not present

## 2017-01-03 DIAGNOSIS — J09X2 Influenza due to identified novel influenza A virus with other respiratory manifestations: Secondary | ICD-10-CM | POA: Insufficient documentation

## 2017-01-03 DIAGNOSIS — J101 Influenza due to other identified influenza virus with other respiratory manifestations: Secondary | ICD-10-CM

## 2017-01-03 DIAGNOSIS — R0602 Shortness of breath: Secondary | ICD-10-CM | POA: Diagnosis not present

## 2017-01-03 DIAGNOSIS — R079 Chest pain, unspecified: Secondary | ICD-10-CM | POA: Diagnosis not present

## 2017-01-03 LAB — INFLUENZA PANEL BY PCR (TYPE A & B)
INFLAPCR: POSITIVE — AB
INFLBPCR: NEGATIVE

## 2017-01-03 LAB — URINALYSIS, ROUTINE W REFLEX MICROSCOPIC
BILIRUBIN URINE: NEGATIVE
Glucose, UA: NEGATIVE mg/dL
Hgb urine dipstick: NEGATIVE
KETONES UR: NEGATIVE mg/dL
LEUKOCYTES UA: NEGATIVE
NITRITE: NEGATIVE
PH: 7 (ref 5.0–8.0)
Protein, ur: NEGATIVE mg/dL
Specific Gravity, Urine: 1.019 (ref 1.005–1.030)

## 2017-01-03 LAB — PREGNANCY, URINE: Preg Test, Ur: NEGATIVE

## 2017-01-03 MED ORDER — AEROCHAMBER PLUS W/MASK MISC
1.0000 | Freq: Once | Status: DC
  Filled 2017-01-03: qty 1

## 2017-01-03 MED ORDER — ALBUTEROL SULFATE HFA 108 (90 BASE) MCG/ACT IN AERS
1.0000 | INHALATION_SPRAY | RESPIRATORY_TRACT | Status: DC | PRN
  Administered 2017-01-03: 2 via RESPIRATORY_TRACT
  Filled 2017-01-03: qty 6.7

## 2017-01-03 MED ORDER — OSELTAMIVIR PHOSPHATE 75 MG PO CAPS: 75.0000 mg | ORAL_CAPSULE | Freq: Two times a day (BID) | ORAL | 0 refills | Status: DC

## 2017-01-03 MED ORDER — ONDANSETRON 4 MG PO TBDP: 4.0000 mg | ORAL_TABLET | Freq: Three times a day (TID) | ORAL | 0 refills | Status: DC | PRN

## 2017-01-03 MED ORDER — IBUPROFEN 600 MG PO TABS: 600.0000 mg | ORAL_TABLET | Freq: Four times a day (QID) | ORAL | 0 refills | Status: DC | PRN

## 2017-01-03 MED ORDER — KETOROLAC TROMETHAMINE 30 MG/ML IJ SOLN
30.0000 mg | Freq: Once | INTRAMUSCULAR | Status: AC
  Administered 2017-01-03: 30 mg via INTRAMUSCULAR
  Filled 2017-01-03: qty 1

## 2017-01-03 MED ORDER — IPRATROPIUM-ALBUTEROL 0.5-2.5 (3) MG/3ML IN SOLN
3.0000 mL | Freq: Once | RESPIRATORY_TRACT | Status: AC
  Administered 2017-01-03: 3 mL via RESPIRATORY_TRACT
  Filled 2017-01-03: qty 3

## 2017-01-03 MED ORDER — ONDANSETRON 4 MG PO TBDP
4.0000 mg | ORAL_TABLET | Freq: Once | ORAL | Status: AC
  Administered 2017-01-03: 4 mg via ORAL
  Filled 2017-01-03: qty 1

## 2017-01-03 NOTE — ED Triage Notes (Signed)
Pt c/o n/v/sorethroat/body aches since yesterday.

## 2017-01-03 NOTE — ED Provider Notes (Signed)
[facility]Y [facility]MENT Provider Note   CSN: [id number] Arrival date & time: 01/03/17  1137     History   Chief Complaint Chief Complaint  Patient presents with  . Emesis    HPI Nancy Russell is a 26 y.o. female.  Pt presents to the ED today with fever, body aches, cough, congestion, n/v/d.  The pt said sx have been going on since yesterday.  No known sick exposures.  Pt tried to take tylenol for her fever and mucinex for her sinus congestion, but threw it up.      Past Medical History:  Diagnosis Date  . Headache(784.0)   . 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 Nexplanon in place 02/12/2015  . Nexplanon removal 02/26/2015  . Vaginal Pap smear, abnormal     Patient Active Problem List   Diagnosis Date Noted  . Encounter for insertion of mirena IUD 10/26/2015  . 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 Abnormal liver function 06/13/2015  . Hyponatremia 06/13/2015  . Hypokalemia 06/13/2015  . Nausea with vomiting   . Nexplanon removal 02/26/2015    Past Surgical History:  Procedure Laterality Date  . ABDOMINAL SURGERY     gastrectomy  . CESAREAN SECTION  01/05/2012   Procedure: CESAREAN SECTION;  Surgeon: Lazaro ArmsLuther H Eure, MD;  Location: [facility];  Service: Obstetrics;  Laterality: N/A;  . CESAREAN SECTION    . CYST EXCISION N/A 01/17/2016   Procedure: EXCISION 3CM CYST ON CHEST WALL;  Surgeon: Franky MachoMark Jenkins, MD;  Location: [facility];  Service: General;  Laterality: N/A;  . CYSTECTOMY  at age 26   cyst removed from my chest   . ESOPHAGOGASTRODUODENOSCOPY  03/15/2015  . STOMACH SURGERY      OB History    Gravida Para Term Preterm AB Living   1 1 0 1 0 2   SAB TAB Ectopic Multiple Live Births   0 0 0 1 2       Home Medications    Prior to Admission medications   Medication Sig Start Date End Date Taking? Authorizing Provider  diclofenac (VOLTAREN) 50 MG EC tablet Take 1 tablet (50 mg total) by mouth 2 (two) times daily. 02/11/16   Janne NapoleonNeese, Hope  M, NP  doxycycline (VIBRAMYCIN) 100 MG capsule Take 1 capsule (100 mg total) by mouth 2 (two) times daily. Patient not taking: Reported on 02/19/2016 01/19/16   Vanetta MuldersZackowski, Scott, MD  ibuprofen (ADVIL,MOTRIN) 600 MG tablet Take 1 tablet (600 mg total) by mouth every 6 (six) hours as needed. 01/03/17   Jacalyn LefevreHaviland, Aireal Slater, MD  methocarbamol (ROBAXIN) 500 MG tablet Take 1 tablet (500 mg total) by mouth 2 (two) times daily. 02/11/16   Janne NapoleonNeese, Hope M, NP  naproxen (NAPROSYN) 500 MG tablet Take 1 tablet (500 mg total) by mouth 2 (two) times daily. Patient not taking: Reported on 02/19/2016 01/19/16   Vanetta MuldersZackowski, Scott, MD  ondansetron (ZOFRAN ODT) 4 MG disintegrating tablet Take 1 tablet (4 mg total) by mouth every 8 (eight) hours as needed. 01/03/17   Jacalyn LefevreHaviland, Sheanna Dail, MD  oseltamivir (TAMIFLU) 75 MG capsule Take 1 capsule (75 mg total) by mouth every 12 (twelve) hours. 01/03/17   Jacalyn LefevreHaviland, Lamees Gable, MD  sulfamethoxazole-trimethoprim (BACTRIM DS,SEPTRA DS) 800-160 MG tablet Take 1 tablet by mouth 2 (two) times daily. Patient not taking: Reported on 01/19/2016 12/24/15   Devoria AlbeKnapp, Iva, MD  traMADol (ULTRAM) 50 MG tablet Take 2 tablets (100 mg total) by mouth every 6 (six)  hours as needed for severe pain. Patient not taking: Reported on 02/19/2016 01/17/16   Franky Macho, MD    Family History Family History  Problem Relation Age of Onset  . Anesthesia problems Neg Hx   . Hypotension Neg Hx   . Malignant hyperthermia Neg Hx   . Pseudochol deficiency Neg Hx   . Other Neg Hx     Social History Social History   Tobacco Use  . Smoking status: Never Smoker  . Smokeless tobacco: Never Used  Substance Use Topics  . Alcohol use: No  . Drug use: No     Allergies   Patient has no known allergies.   Review of Systems Review of Systems  Constitutional: Positive for chills and fever.  HENT: Positive for congestion and sore throat.   Respiratory: Positive for cough, shortness of breath and wheezing.       Physical Exam Updated Vital Signs BP 132/76 (BP Location: Left Arm)   Pulse 100   Temp (!) 100.8 F (38.2 C) (Oral)   Resp 18   LMP  (LMP Unknown)   SpO2 98%   Physical Exam  Constitutional: She is oriented to person, place, and time. She appears well-developed and well-nourished.  HENT:  Head: Normocephalic and atraumatic.  Right Ear: External ear normal.  Left Ear: External ear normal.  Nose: Rhinorrhea present.  Mouth/Throat: Oropharynx is clear and moist.  Eyes: Conjunctivae and EOM are normal. Pupils are equal, round, and reactive to light.  Neck: Normal range of motion. Neck supple.  Cardiovascular: Normal rate, regular rhythm, normal heart sounds and intact distal pulses.  Pulmonary/Chest: She has wheezes.  Abdominal: Soft. Bowel sounds are normal.  Musculoskeletal: Normal range of motion.  Neurological: She is alert and oriented to person, place, and time.  Skin: Skin is warm. Capillary refill takes less than 2 seconds.  Psychiatric: She has a normal mood and affect. Her behavior is normal. Judgment and thought content normal.  Nursing note and vitals reviewed.    ED Treatments / Results  Labs (all labs ordered are listed, but only abnormal results are displayed) Labs Reviewed  INFLUENZA PANEL BY PCR (TYPE A & B) - Abnormal; Notable for the following components:      Result Value   Influenza A By PCR POSITIVE (*)    All other components within normal limits  PREGNANCY, URINE  URINALYSIS, ROUTINE W REFLEX MICROSCOPIC    EKG  EKG Interpretation None       Radiology Dg Chest 2 View  Result Date: 01/03/2017 CLINICAL DATA:  Chest pain and sore throat EXAM: CHEST  2 VIEW COMPARISON:  January 19, 2016 FINDINGS: Lungs are clear. Heart size and pulmonary vascularity are normal. No adenopathy. No pneumothorax. No bone lesions. IMPRESSION: No edema or consolidation. Electronically Signed   By: Bretta Bang III M.D.   On: 01/03/2017 13:21     Procedures Procedures (including critical care time)  Medications Ordered in ED Medications  albuterol (PROVENTIL HFA;VENTOLIN HFA) 108 (90 Base) MCG/ACT inhaler 1-2 puff (not administered)  AEROCHAMBER PLUS FLO-VU MEDIUM MISC 1 each (not administered)  ketorolac (TORADOL) 30 MG/ML injection 30 mg (30 mg Intramuscular Given 01/03/17 1231)  ondansetron (ZOFRAN-ODT) disintegrating tablet 4 mg (4 mg Oral Given 01/03/17 1231)  ipratropium-albuterol (DUONEB) 0.5-2.5 (3) MG/3ML nebulizer solution 3 mL (3 mLs Nebulization Given 01/03/17 1250)     Initial Impression / Assessment and Plan / ED Course  I have reviewed the triage vital signs and the nursing notes.  Pertinent labs & imaging results that were available during my care of the patient were reviewed by me and considered in my medical decision making (see chart for details).    Pt is feeling a little better.  She knows to return if worse.  Final Clinical Impressions(s) / ED Diagnoses   Final diagnoses:  Influenza A    ED Discharge Orders        Ordered    ibuprofen (ADVIL,MOTRIN) 600 MG tablet  Every 6 hours PRN     01/03/17 1333    ondansetron (ZOFRAN ODT) 4 MG disintegrating tablet  Every 8 hours PRN     01/03/17 1333    oseltamivir (TAMIFLU) 75 MG capsule  Every 12 hours     01/03/17 1333       Jacalyn LefevreHaviland, Galya Dunnigan, MD 01/03/17 1334

## 2018-04-04 DIAGNOSIS — R21 Rash and other nonspecific skin eruption: Secondary | ICD-10-CM | POA: Diagnosis not present

## 2018-04-12 DIAGNOSIS — Z0001 Encounter for general adult medical examination with abnormal findings: Secondary | ICD-10-CM | POA: Diagnosis not present

## 2018-04-12 DIAGNOSIS — Z9884 Bariatric surgery status: Secondary | ICD-10-CM | POA: Diagnosis not present

## 2018-04-12 DIAGNOSIS — Z6836 Body mass index (BMI) 36.0-36.9, adult: Secondary | ICD-10-CM | POA: Diagnosis not present

## 2018-04-12 DIAGNOSIS — Z1389 Encounter for screening for other disorder: Secondary | ICD-10-CM | POA: Diagnosis not present
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
## 2018-06-26 DIAGNOSIS — E65 Localized adiposity: Secondary | ICD-10-CM | POA: Diagnosis not present

## 2018-08-22 DIAGNOSIS — Z20828 Contact with and (suspected) exposure to other viral communicable diseases: Secondary | ICD-10-CM | POA: Diagnosis not present

## 2018-12-24 DIAGNOSIS — N342 Other urethritis: Secondary | ICD-10-CM | POA: Diagnosis not present

## 2018-12-24 DIAGNOSIS — E6609 Other obesity due to excess calories: Secondary | ICD-10-CM | POA: Diagnosis not present

## 2018-12-24 DIAGNOSIS — R519 Headache, unspecified: Secondary | ICD-10-CM | POA: Diagnosis not present

## 2018-12-24 DIAGNOSIS — Z6834 Body mass index (BMI) 34.0-34.9, adult: Secondary | ICD-10-CM | POA: Diagnosis not present

## 2019-09-30 ENCOUNTER — Other Ambulatory Visit: Payer: Self-pay

## 2019-09-30 ENCOUNTER — Emergency Department (HOSPITAL_COMMUNITY)
Admission: EM | Admit: 2019-09-30 | Discharge: 2019-09-30 | Disposition: A | Discharge status: Home / Self Care | Payer: HRSA Program | Attending: Emergency Medicine | Admitting: Emergency Medicine

## 2019-09-30 ENCOUNTER — Encounter (HOSPITAL_COMMUNITY): Payer: Self-pay | Admitting: *Deleted

## 2019-09-30 ENCOUNTER — Emergency Department (HOSPITAL_COMMUNITY): Payer: HRSA Program

## 2019-09-30 DIAGNOSIS — U071 COVID-19: Secondary | ICD-10-CM

## 2019-09-30 DIAGNOSIS — J101 Influenza due to other identified influenza virus with other respiratory manifestations: Secondary | ICD-10-CM | POA: Diagnosis present

## 2019-09-30 LAB — URINALYSIS, ROUTINE W REFLEX MICROSCOPIC
Bacteria, UA: NONE SEEN
Bilirubin Urine: NEGATIVE
Glucose, UA: NEGATIVE mg/dL
Ketones, ur: 80 mg/dL — AB
Leukocytes,Ua: NEGATIVE
Nitrite: NEGATIVE
Protein, ur: NEGATIVE mg/dL
Specific Gravity, Urine: 1.027 (ref 1.005–1.030)
pH: 5 (ref 5.0–8.0)

## 2019-09-30 LAB — COMPREHENSIVE METABOLIC PANEL
ALT: 18 U/L (ref 0–44)
AST: 19 U/L (ref 15–41)
Albumin: 4.2 g/dL (ref 3.5–5.0)
Alkaline Phosphatase: 39 U/L (ref 38–126)
Anion gap: 12 (ref 5–15)
BUN: 13 mg/dL (ref 6–20)
CO2: 20 mmol/L — ABNORMAL LOW (ref 22–32)
Calcium: 8.7 mg/dL — ABNORMAL LOW (ref 8.9–10.3)
Chloride: 104 mmol/L (ref 98–111)
Creatinine, Ser: 0.76 mg/dL (ref 0.44–1.00)
GFR calc Af Amer: >60 mL/min (ref >60)
GFR calc non Af Amer: >60 mL/min (ref >60)
Glucose, Bld: 92 mg/dL (ref 70–99)
Potassium: 3.5 mmol/L (ref 3.5–5.1)
Sodium: 136 mmol/L (ref 135–145)
Total Bilirubin: 1.3 mg/dL — ABNORMAL HIGH (ref 0.3–1.2)
Total Protein: 7.6 g/dL (ref 6.5–8.1)

## 2019-09-30 LAB — CBC WITH DIFFERENTIAL/PLATELET
Abs Immature Granulocytes: 0.03 10*3/uL (ref 0.00–0.07)
Basophils Absolute: 0 10*3/uL (ref 0.0–0.1)
Basophils Relative: 0 %
Eosinophils Absolute: 0 10*3/uL (ref 0.0–0.5)
Eosinophils Relative: 0 %
HCT: 38.5 % (ref 36.0–46.0)
Hemoglobin: 12.9 g/dL (ref 12.0–15.0)
Immature Granulocytes: 1 %
Lymphocytes Relative: 8 %
Lymphs Abs: 0.5 10*3/uL — ABNORMAL LOW (ref 0.7–4.0)
MCH: 31.6 pg (ref 26.0–34.0)
MCHC: 33.5 g/dL (ref 30.0–36.0)
MCV: 94.4 fL (ref 80.0–100.0)
Monocytes Absolute: 0.6 10*3/uL (ref 0.1–1.0)
Monocytes Relative: 10 %
Neutro Abs: 5.1 10*3/uL (ref 1.7–7.7)
Neutrophils Relative %: 81 %
Platelets: 246 10*3/uL (ref 150–400)
RBC: 4.08 MIL/uL (ref 3.87–5.11)
RDW: 12 % (ref 11.5–15.5)
WBC: 6.2 10*3/uL (ref 4.0–10.5)
nRBC: 0 % (ref 0.0–0.2)

## 2019-09-30 LAB — SARS CORONAVIRUS 2 BY RT PCR (HOSPITAL ORDER, PERFORMED IN ~~LOC~~ HOSPITAL LAB): SARS Coronavirus 2: POSITIVE — AB

## 2019-09-30 LAB — LIPASE, BLOOD: Lipase: 25 U/L (ref 11–51)

## 2019-09-30 LAB — HCG, QUANTITATIVE, PREGNANCY: hCG, Beta Chain, Quant, S: <1 m[IU]/mL (ref <5)

## 2019-09-30 MED ORDER — ONDANSETRON HCL 4 MG PO TABS: 4.0000 mg | ORAL_TABLET | Freq: Three times a day (TID) | ORAL | 0 refills | Status: DC | PRN

## 2019-09-30 MED ORDER — ONDANSETRON HCL 4 MG/2ML IJ SOLN
4.0000 mg | Freq: Once | INTRAMUSCULAR | Status: AC
  Administered 2019-09-30: 4 mg via INTRAVENOUS
  Filled 2019-09-30: qty 2

## 2019-09-30 MED ORDER — IBUPROFEN 800 MG PO TABS
800.0000 mg | ORAL_TABLET | Freq: Once | ORAL | Status: AC
  Administered 2019-09-30: 800 mg via ORAL
  Filled 2019-09-30: qty 1

## 2019-09-30 MED ORDER — SODIUM CHLORIDE 0.9 % IV BOLUS
1000.0000 mL | Freq: Once | INTRAVENOUS | Status: AC
  Administered 2019-09-30: 1000 mL via INTRAVENOUS

## 2019-09-30 NOTE — Discharge Instructions (Addendum)
Person Under Monitoring Name: Nancy Russell  Location: [address] [place] [address]   Infection Prevention Recommendations for Individuals Confirmed to have, or Being Evaluated for, 2019 Novel Coronavirus (COVID-19) Infection Who Receive Care at Home  Individuals who are confirmed to have, or are being evaluated for, COVID-19 should follow the prevention steps below until a healthcare provider or local or state health department says they can return to normal activities.  Stay home except to get medical care You should restrict activities outside your home, except for getting medical care. Do not go to work, school, or public areas, and do not use public transportation or taxis.  Call ahead before visiting your doctor Before your medical appointment, call the healthcare provider and tell them that you have, or are being evaluated for, COVID-19 infection. This will help the healthcare provider's office take steps to keep other people from getting infected. Ask your healthcare provider to call the local or state health department.  Monitor your symptoms Seek prompt medical attention if your illness is worsening (e.g., difficulty breathing). Before going to your medical appointment, call the healthcare provider and tell them that you have, or are being evaluated for, COVID-19 infection. Ask your healthcare provider to call the local or state health department.  Wear a facemask You should wear a facemask that covers your nose and mouth when you are in the same room with other people and when you visit a healthcare provider. People who live with or visit you should also wear a facemask while they are in the same room with you.  Separate yourself from other people in your home As much as possible, you should stay in a different room from other people in your home. Also, you should use a separate bathroom, if available.  Avoid sharing household items You should not share  dishes, drinking glasses, cups, eating utensils, towels, bedding, or other items with other people in your home. After using these items, you should wash them thoroughly with soap and water.  Cover your coughs and sneezes Cover your mouth and nose with a tissue when you cough or sneeze, or you can cough or sneeze into your sleeve. Throw used tissues in a lined trash can, and immediately wash your hands with soap and water for at least 20 seconds or use an alcohol-based hand rub.  Wash your [employer] your hands often and thoroughly with soap and water for at least 20 seconds. You can use an alcohol-based hand sanitizer if soap and water are not available and if your hands are not visibly dirty. Avoid touching your eyes, nose, and mouth with unwashed hands.   Prevention Steps for Caregivers and Household Members of Individuals Confirmed to have, or Being Evaluated for, COVID-19 Infection Being Cared for in the Home  If you live with, or provide care at home for, a person confirmed to have, or being evaluated for, COVID-19 infection please follow these guidelines to prevent infection:  Follow healthcare provider's instructions Make sure that you understand and can help the patient follow any healthcare provider instructions for all care.  Provide for the patient's basic needs You should help the patient with basic needs in the home and provide support for getting groceries, prescriptions, and other personal needs.  Monitor the patient's symptoms If they are getting sicker, call his or her medical provider and tell them that the patient has, or is being evaluated for, COVID-19 infection. This will help the healthcare provider's office  take steps to keep other people from getting infected. Ask the healthcare provider to call the local or state health department.  Limit the number of people who have contact with the patient If possible, have only one caregiver for the patient. Other  household members should stay in another home or place of residence. If this is not possible, they should stay in another room, or be separated from the patient as much as possible. Use a separate bathroom, if available. Restrict visitors who do not have an essential need to be in the home.  Keep older adults, very young children, and other sick people away from the patient Keep older adults, very young children, and those who have compromised immune systems or chronic health conditions away from the patient. This includes people with chronic heart, lung, or kidney conditions, diabetes, and cancer.  Ensure good ventilation Make sure that shared spaces in the home have good air flow, such as from an air conditioner or an opened window, weather permitting.  Wash your hands often Wash your hands often and thoroughly with soap and water for at least 20 seconds. You can use an alcohol based hand sanitizer if soap and water are not available and if your hands are not visibly dirty. Avoid touching your eyes, nose, and mouth with unwashed hands. Use disposable paper towels to dry your hands. If not available, use dedicated cloth towels and replace them when they become wet.  Wear a facemask and gloves Wear a disposable facemask at all times in the room and gloves when you touch or have contact with the patient's blood, body fluids, and/or secretions or excretions, such as sweat, saliva, sputum, nasal mucus, vomit, urine, or feces.  Ensure the mask fits over your nose and mouth tightly, and do not touch it during use. Throw out disposable facemasks and gloves after using them. Do not reuse. Wash your hands immediately after removing your facemask and gloves. If your personal clothing becomes contaminated, carefully remove clothing and launder. Wash your hands after handling contaminated clothing. Place all used disposable facemasks, gloves, and other waste in a lined container before disposing them with  other household waste. Remove gloves and wash your hands immediately after handling these items.  Do not share dishes, glasses, or other household items with the patient Avoid sharing household items. You should not share dishes, drinking glasses, cups, eating utensils, towels, bedding, or other items with a patient who is confirmed to have, or being evaluated for, COVID-19 infection. After the person uses these items, you should wash them thoroughly with soap and water.  Wash laundry thoroughly Immediately remove and wash clothes or bedding that have blood, body fluids, and/or secretions or excretions, such as sweat, saliva, sputum, nasal mucus, vomit, urine, or feces, on them. Wear gloves when handling laundry from the patient. Read and follow directions on labels of laundry or clothing items and detergent. In general, wash and dry with the warmest temperatures recommended on the label.  Clean all areas the individual has used often Clean all touchable surfaces, such as counters, tabletops, doorknobs, bathroom fixtures, toilets, phones, keyboards, tablets, and bedside tables, every day. Also, clean any surfaces that may have blood, body fluids, and/or secretions or excretions on them. Wear gloves when cleaning surfaces the patient has come in contact with. Use a diluted bleach solution (e.g., dilute bleach with 1 part bleach and 10 parts water) or a household disinfectant with a label that says EPA-registered for coronaviruses. To make a bleach  solution at home, add 1 tablespoon of bleach to 1 quart (4 cups) of water. For a larger supply, add  cup of bleach to 1 gallon (16 cups) of water. Read labels of cleaning products and follow recommendations provided on product labels. Labels contain instructions for safe and effective use of the cleaning product including precautions you should take when applying the product, such as wearing gloves or eye protection and making sure you have good ventilation  during use of the product. Remove gloves and wash hands immediately after cleaning.  Monitor yourself for signs and symptoms of illness Caregivers and household members are considered close contacts, should monitor their health, and will be asked to limit movement outside of the home to the extent possible. Follow the monitoring steps for close contacts listed on the symptom monitoring form.   ? If you have additional questions, contact your local health department or call the epidemiologist on call at [phone] (available 24/7). ? This guidance is subject to change. For the most up-to-date guidance from [facility], please refer to their website: YouBlogs.pl

## 2019-09-30 NOTE — ED Notes (Signed)
Pt currently vomiting. Pt has emesis bag at bedside.

## 2019-09-30 NOTE — ED Triage Notes (Signed)
Pt with covid exposure, had test done at work [facility]) and was negative.  covid test from [facility] that was done yesterday and waiting on results. Pt c./o HA last night, chest pain, chills, balance feels off and sob.

## 2019-09-30 NOTE — ED Provider Notes (Signed)
[facility] EMERGENCY DEPARTMENT Provider Note   CSN: [id number] Arrival date & time: 09/30/19  1547     History Chief Complaint  Patient presents with  . Headache    Nancy Russell is a 29 y.o. female who presents with a cc of Flu like sxs. Onset yesterday. Patient has sxs of bodyaches, chills, anorexia, vomiting, diarrhea, cough and sensation of sob. The patient was exposed to COVID last week when her aunt got it. She also works at an [employer] and gets POC Covid testign 2x weekly. She was tested by [facility] today and test is pending. She denies abdominal pain or hx of obstruction.  HPI     Past Medical History:  Diagnosis Date  . Headache(784.0)   . 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 Nexplanon in place 02/12/2015  . Nexplanon removal 02/26/2015  . Vaginal Pap smear, abnormal     Patient Active Problem List   Diagnosis Date Noted  . Encounter for insertion of mirena IUD 10/26/2015  . 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 Abnormal liver function 06/13/2015  . Hyponatremia 06/13/2015  . Hypokalemia 06/13/2015  . Nausea with vomiting   . Nexplanon removal 02/26/2015    Past Surgical History:  Procedure Laterality Date  . ABDOMINAL SURGERY     gastrectomy  . CESAREAN SECTION  01/05/2012   Procedure: CESAREAN SECTION;  Surgeon: Lazaro Arms, MD;  Location: [facility];  Service: Obstetrics;  Laterality: N/A;  . CESAREAN SECTION    . CYST EXCISION N/A 01/17/2016   Procedure: EXCISION 3CM CYST ON CHEST WALL;  Surgeon: Franky Macho, MD;  Location: [facility];  Service: General;  Laterality: N/A;  . CYSTECTOMY  at age 55   cyst removed from my chest   . ESOPHAGOGASTRODUODENOSCOPY  03/15/2015  . STOMACH SURGERY       OB History    Gravida  1   Para  1   Term  0   Preterm  1   AB  0   Living  2     SAB  0   TAB  0   Ectopic  0   Multiple  1   Live Births  2           Family History  Problem Relation Age of Onset  . Anesthesia problems Neg Hx   .  Hypotension Neg Hx   . Malignant hyperthermia Neg Hx   . Pseudochol deficiency Neg Hx   . Other Neg Hx     Social History   Tobacco Use  . Smoking status: Never Smoker  . Smokeless tobacco: Never Used  Substance Use Topics  . Alcohol use: No  . Drug use: No    Home Medications Prior to Admission medications   Medication Sig Start Date End Date Taking? Authorizing Provider  ondansetron (ZOFRAN) 4 MG tablet Take 1 tablet (4 mg total) by mouth every 8 (eight) hours as needed for nausea or vomiting. 09/30/19   Arthor Captain, PA-C  ondansetron (ZOFRAN) 4 MG tablet Take 1 tablet (4 mg total) by mouth every 8 (eight) hours as needed for nausea or vomiting. 09/30/19   Arthor Captain, PA-C    Allergies    Patient has no known allergies.  Review of Systems   Review of Systems   Ten systems reviewed and are negative for acute change, except as noted in the HPI.   Physical Exam Updated Vital Signs BP 115/88 (BP Location: Right Arm)  Pulse 66   Temp 98.7 F (37.1 C) (Oral)   Resp 20   Ht 5\' 5"  (1.651 m)   Wt 99.8 kg   SpO2 100%   BMI 36.61 kg/m   Physical Exam Vitals and nursing note reviewed.  Constitutional:      General: She is not in acute distress.    Appearance: She is well-developed. She is not diaphoretic.  HENT:     Head: Normocephalic and atraumatic.  Eyes:     General: No scleral icterus.    Conjunctiva/sclera: Conjunctivae normal.  Cardiovascular:     Rate and Rhythm: Normal rate and regular rhythm.     Heart sounds: Normal heart sounds. No murmur heard.  No friction rub. No gallop.   Pulmonary:     Effort: Pulmonary effort is normal. No respiratory distress.     Breath sounds: Normal breath sounds.  Abdominal:     General: Bowel sounds are normal. There is no distension.     Palpations: Abdomen is soft. There is no mass.     Tenderness: There is no abdominal tenderness. There is no guarding.  Musculoskeletal:     Cervical back: Normal range of  motion.  Skin:    General: Skin is warm and dry.  Neurological:     Mental Status: She is alert and oriented to person, place, and time.  Psychiatric:        Behavior: Behavior normal.     ED Results / Procedures / Treatments   Labs (all labs ordered are listed, but only abnormal results are displayed) Labs Reviewed  SARS CORONAVIRUS 2 BY RT PCR (HOSPITAL ORDER, PERFORMED IN [facility]) - Abnormal; Notable for the following components:      Result Value   SARS Coronavirus 2 POSITIVE (*)    All other components within normal limits  COMPREHENSIVE METABOLIC PANEL - Abnormal; Notable for the following components:   CO2 20 (*)    Calcium 8.7 (*)    Total Bilirubin 1.3 (*)    All other components within normal limits  CBC WITH DIFFERENTIAL/PLATELET - Abnormal; Notable for the following components:   Lymphs Abs 0.5 (*)    All other components within normal limits  URINALYSIS, ROUTINE W REFLEX MICROSCOPIC - Abnormal; Notable for the following components:   APPearance CLOUDY (*)    Hgb urine dipstick MODERATE (*)    Ketones, ur 80 (*)    All other components within normal limits  LIPASE, BLOOD  HCG, QUANTITATIVE, PREGNANCY    EKG None  Radiology DG Chest Portable 1 View  Result Date: 09/30/2019 CLINICAL DATA:  Chest pain EXAM: PORTABLE CHEST 1 VIEW COMPARISON:  01/03/2017 FINDINGS: The heart size and mediastinal contours are within normal limits. Both lungs are clear. The visualized skeletal structures are unremarkable. IMPRESSION: No active disease. Electronically Signed   By: 14/05/2016 M.D.   On: 09/30/2019 16:54    Procedures Procedures (including critical care time)  Medications Ordered in ED Medications  sodium chloride 0.9 % bolus 1,000 mL (0 mLs Intravenous Stopped 09/30/19 2248)  ibuprofen (ADVIL) tablet 800 mg (800 mg Oral Given 09/30/19 2103)  ondansetron (ZOFRAN) injection 4 mg (4 mg Intravenous Given 09/30/19 2103)    ED Course  I have  reviewed the triage vital signs and the nursing notes.  Pertinent labs & imaging results that were available during my care of the patient were reviewed by me and considered in my medical decision making (see chart for details).  MDM Rules/Calculators/A&P                         Patient here with complaint of flulike symptoms. Differential includes any number of infectious causes including bacterial or viral in the setting of the Covid pandemic this is very high on my differential.  I doubt autoimmune process. I ordered interpreted and reviewed labs which include CBC without abnormality, CMP with slightly low bicarb likely due to vomiting and diarrhea, Lipase and hCG WNL Covid test positive I ordered and reviewed images of the portable 1 view chest x-ray which showed no acute abnormalities EKG shows normal sinus rhythm at a rate of 62. The patient is otherwise hemodynamically stable without active vomiting, fever or hypoxia.  Patient be treated symptomatically.  I referred the patient to the [facility] clinic for infusion as she meets criteria.  She appears otherwise appropriate for discharge at this time   Nancy Russell was evaluated in Emergency Department on 10/01/2019 for the symptoms described in the history of present illness. She was evaluated in the context of the global COVID-19 pandemic, which necessitated consideration that the patient might be at risk for infection with the SARS-CoV-2 virus that causes COVID-19. Institutional protocols and algorithms that pertain to the evaluation of patients at risk for COVID-19 are in a state of rapid change based on information released by regulatory bodies including the CDC and federal and state organizations. These policies and algorithms were followed during the patient's care in the ED.  Final Clinical Impression(s) / ED Diagnoses Final diagnoses:  COVID-19 virus infection    Rx / DC Orders ED Discharge Orders         Ordered    ondansetron  (ZOFRAN) 4 MG tablet  Every 8 hours PRN        09/30/19 2239    ondansetron (ZOFRAN) 4 MG tablet  Every 8 hours PRN        09/30/19 2240           Arthor Captain, PA-C 10/01/19 1217    Bethann Berkshire, MD 10/02/19 [phone]

## 2019-09-30 NOTE — ED Notes (Signed)
Pt denies nausea at this time.

## 2019-09-30 NOTE — ED Notes (Signed)
CRITICAL VALUE ALERT  Critical Value:  COVID POSITIVE (+)  Date & Time Notied:  09/30/2019 at 2215  Provider Notified: Arthor Captain EDPA  Orders Received/Actions taken: Continue Precautions

## 2019-10-01 ENCOUNTER — Telehealth: Payer: Self-pay | Admitting: Family

## 2019-10-01 NOTE — Telephone Encounter (Signed)
Called to Discuss with patient about Covid symptoms and the use of the monoclonal antibody infusion for those with mild to moderate Covid symptoms and at a high risk of hospitalization.     Pt appears to qualify for this infusion due to co-morbid conditions and/or a member of an at-risk group in accordance with the FDA Emergency Use Authorization.    Ms. Wengert was recently seen in the [facility] ED with body aches, chills, anorexia, vomiting and shortness of breath. Symptom onset was 8/30. Risk factors include BMI >25.  Unable to reach patient and has no voicemail set up.  Information sent to MyChart.   Marcos Eke, NP  10/01/2019 11:23 AM

## 2020-04-15 ENCOUNTER — Other Ambulatory Visit: Payer: Self-pay

## 2020-04-15 ENCOUNTER — Emergency Department (HOSPITAL_COMMUNITY): Payer: Self-pay

## 2020-04-15 ENCOUNTER — Emergency Department (HOSPITAL_COMMUNITY)
Admission: EM | Admit: 2020-04-15 | Discharge: 2020-04-15 | Disposition: A | Discharge status: Home / Self Care | Payer: Self-pay | Attending: Emergency Medicine | Admitting: Emergency Medicine

## 2020-04-15 DIAGNOSIS — R0789 Other chest pain: Secondary | ICD-10-CM | POA: Insufficient documentation

## 2020-04-15 DIAGNOSIS — H5713 Ocular pain, bilateral: Secondary | ICD-10-CM | POA: Insufficient documentation

## 2020-04-15 DIAGNOSIS — R079 Chest pain, unspecified: Secondary | ICD-10-CM

## 2020-04-15 DIAGNOSIS — R519 Headache, unspecified: Secondary | ICD-10-CM

## 2020-04-15 DIAGNOSIS — R112 Nausea with vomiting, unspecified: Secondary | ICD-10-CM

## 2020-04-15 MED ORDER — NAPROXEN 500 MG PO TABS: 500.0000 mg | ORAL_TABLET | Freq: Two times a day (BID) | ORAL | 0 refills | Status: DC

## 2020-04-15 MED ORDER — KETOROLAC TROMETHAMINE 60 MG/2ML IM SOLN
60.0000 mg | Freq: Once | INTRAMUSCULAR | Status: AC
  Administered 2020-04-15: 60 mg via INTRAMUSCULAR
  Filled 2020-04-15: qty 2

## 2020-04-15 MED ORDER — ONDANSETRON 4 MG PO TBDP: 4.0000 mg | ORAL_TABLET | Freq: Three times a day (TID) | ORAL | 0 refills | Status: DC | PRN

## 2020-04-15 MED ORDER — ONDANSETRON 4 MG PO TBDP
4.0000 mg | ORAL_TABLET | Freq: Once | ORAL | Status: AC
  Administered 2020-04-15: 4 mg via ORAL
  Filled 2020-04-15: qty 1

## 2020-04-15 NOTE — ED Triage Notes (Signed)
Pt here pov from home with cc of chest pains, dizziness, lightheadedness that started around lunch time. Middle chest, mainly on right

## 2020-04-15 NOTE — ED Provider Notes (Signed)
[facility] EMERGENCY DEPARTMENT Provider Note   CSN: [id number] Arrival date & time: 04/15/20  2036     History Chief Complaint  Patient presents with  . Chest Pain    Nancy Russell is a 30 y.o. female.  HPI   This patient is a 30 year old female, history of a prior sleeve gastrectomy with significant weight loss, no other significant chronic medical conditions and takes no daily medications.  She reports that she woke up this morning and was in her normal state of health, she went to work and as the day went on she developed a headache followed by eye pain followed by vomiting and had vomited multiple times throughout the day.  Approximately 2 hours ago she developed some chest discomfort on the right side of her chest which is sharp and stabbing, not associated with coughing, shortness of breath, fevers or chills and she has had no diarrhea.  She denies swelling in her legs, denies any respiratory symptoms or symptoms of a cold.  She has not been around anybody who has been sick, no recent travel, no new medications.  Symptoms are intermittent, chest pain is sharp, right-sided, radiates to the right shoulder, worse with palpation of the chest  Past Medical History:  Diagnosis Date  . Headache(784.0)   . 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 Nexplanon in place 02/12/2015  . Nexplanon removal 02/26/2015  . Vaginal Pap smear, abnormal     Patient Active Problem List   Diagnosis Date Noted  . Encounter for insertion of mirena IUD 10/26/2015  . 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 Abnormal liver function 06/13/2015  . Hyponatremia 06/13/2015  . Hypokalemia 06/13/2015  . Nausea with vomiting   . Nexplanon removal 02/26/2015    Past Surgical History:  Procedure Laterality Date  . ABDOMINAL SURGERY     gastrectomy  . CESAREAN SECTION  01/05/2012   Procedure: CESAREAN SECTION;  Surgeon: Lazaro Arms, MD;  Location: [facility];  Service: Obstetrics;  Laterality: N/A;   . CESAREAN SECTION    . CYST EXCISION N/A 01/17/2016   Procedure: EXCISION 3CM CYST ON CHEST WALL;  Surgeon: Franky Macho, MD;  Location: [facility];  Service: General;  Laterality: N/A;  . CYSTECTOMY  at age 54   cyst removed from my chest   . ESOPHAGOGASTRODUODENOSCOPY  03/15/2015  . STOMACH SURGERY       OB History    Gravida  1   Para  1   Term  0   Preterm  1   AB  0   Living  2     SAB  0   IAB  0   Ectopic  0   Multiple  1   Live Births  2           Family History  Problem Relation Age of Onset  . Anesthesia problems Neg Hx   . Hypotension Neg Hx   . Malignant hyperthermia Neg Hx   . Pseudochol deficiency Neg Hx   . Other Neg Hx     Social History   Tobacco Use  . Smoking status: Never Smoker  . Smokeless tobacco: Never Used  Substance Use Topics  . Alcohol use: No  . Drug use: No    Home Medications Prior to Admission medications   Medication Sig Start Date End Date Taking? Authorizing Provider  naproxen (NAPROSYN) 500 MG tablet Take 1 tablet (500 mg total) by mouth 2 (two)  times daily with a meal. 04/15/20  Yes Eber Hong, MD  ondansetron (ZOFRAN ODT) 4 MG disintegrating tablet Take 1 tablet (4 mg total) by mouth every 8 (eight) hours as needed for nausea. 04/15/20  Yes Eber Hong, MD  ondansetron (ZOFRAN) 4 MG tablet Take 1 tablet (4 mg total) by mouth every 8 (eight) hours as needed for nausea or vomiting. 09/30/19   Arthor Captain, PA-C  ondansetron (ZOFRAN) 4 MG tablet Take 1 tablet (4 mg total) by mouth every 8 (eight) hours as needed for nausea or vomiting. 09/30/19   Arthor Captain, PA-C    Allergies    Patient has no known allergies.  Review of Systems   Review of Systems  All other systems reviewed and are negative.   Physical Exam Updated Vital Signs BP 96/64   Pulse (!) 57   Temp 97.7 F (36.5 C)   Resp 15   Ht 1.651 m (5\' 5" )   Wt 90.7 kg   SpO2 100%   BMI 33.28 kg/m   Physical Exam Vitals and nursing note  reviewed.  Constitutional:      General: She is not in acute distress.    Appearance: She is well-developed.  HENT:     Head: Normocephalic and atraumatic.     Mouth/Throat:     Pharynx: No oropharyngeal exudate.  Eyes:     General: No scleral icterus.       Right eye: No discharge.        Left eye: No discharge.     Conjunctiva/sclera: Conjunctivae normal.     Pupils: Pupils are equal, round, and reactive to light.  Neck:     Thyroid: No thyromegaly.     Vascular: No JVD.  Cardiovascular:     Rate and Rhythm: Normal rate and regular rhythm.     Heart sounds: Normal heart sounds. No murmur heard. No friction rub. No gallop.   Pulmonary:     Effort: Pulmonary effort is normal. No respiratory distress.     Breath sounds: Normal breath sounds. No wheezing or rales.  Chest:     Chest wall: Tenderness present.  Abdominal:     General: Bowel sounds are normal. There is no distension.     Palpations: Abdomen is soft. There is no mass.     Tenderness: There is no abdominal tenderness.  Musculoskeletal:        General: No tenderness. Normal range of motion.     Cervical back: Normal range of motion and neck supple.  Lymphadenopathy:     Cervical: No cervical adenopathy.  Skin:    General: Skin is warm and dry.     Findings: No erythema or rash.  Neurological:     Mental Status: She is alert.     Coordination: Coordination normal.     Comments: Normal speech, coordination, strength in all 4 extremities and cranial nerves III through XII are normal  Psychiatric:        Behavior: Behavior normal.     ED Results / Procedures / Treatments   Labs (all labs ordered are listed, but only abnormal results are displayed) Labs Reviewed - No data to display  EKG EKG Interpretation  Date/Time:  Thursday April 15 2020 21:03:56 EDT Ventricular Rate:  65 PR Interval:    QRS Duration: 91 QT Interval:  426 QTC Calculation: 443 R Axis:   64 Text Interpretation: Sinus rhythm Normal  ECG since last tracing no significant change Confirmed by 10-10-2004 (Eber Hong) on  04/15/2020 9:11:18 PM   Radiology DG Chest 2 View  Result Date: 04/15/2020 CLINICAL DATA:  Chest pain and dizziness. EXAM: CHEST - 2 VIEW COMPARISON:  September 30, 2019 FINDINGS: The heart size and mediastinal contours are within normal limits. Both lungs are clear. The visualized skeletal structures are unremarkable. IMPRESSION: No active cardiopulmonary disease. Electronically Signed   By: Aram Candela M.D.   On: 04/15/2020 22:03    Procedures Procedures   Medications Ordered in ED Medications  ondansetron (ZOFRAN-ODT) disintegrating tablet 4 mg (4 mg Oral Given 04/15/20 2126)  ketorolac (TORADOL) injection 60 mg (60 mg Intramuscular Given 04/15/20 2126)    ED Course  I have reviewed the triage vital signs and the nursing notes.  Pertinent labs & imaging results that were available during my care of the patient were reviewed by me and considered in my medical decision making (see chart for details).    MDM Rules/Calculators/A&P                          The patient's exam is unremarkable, her EKG is totally normal, there is no tachycardia, no ST elevation, no arrhythmia.  Will obtain a chest x-ray to make sure there is no signs of pneumothorax, pneumomediastinum, I doubt that this is Boerhaave's, this is more likely related to a chest wall pain which is likely secondary to the recurrent vomiting.  The patient has a headache and eye pain bilaterally but has a normal neurologic exam.  We will give anti-inflammatory and antinausea medicine.  Patient agreeable  Pain improved, nausea improved, chest x-ray negative, stable for discharge, doubt pulmonary embolism, acute coronary syndrome or other severe cause of chest pain.  Final Clinical Impression(s) / ED Diagnoses Final diagnoses:  Right-sided chest pain  Nonintractable headache, unspecified chronicity pattern, unspecified headache type   Non-intractable vomiting with nausea, unspecified vomiting type    Rx / DC Orders ED Discharge Orders         Ordered    ondansetron (ZOFRAN ODT) 4 MG disintegrating tablet  Every 8 hours PRN        04/15/20 2206    naproxen (NAPROSYN) 500 MG tablet  2 times daily with meals        04/15/20 2206           Eber Hong, MD 04/15/20 2208

## 2020-04-15 NOTE — Discharge Instructions (Signed)
Your testing is normal and shows no signs of heart disease, lung disease, pneumonia, heart attack or any other concerning findings.  Please take the Zofran every 6 hours as needed for nausea, naproxen twice a day as needed for pain including headache or chest pain.  Seek a medical exam immediately for severe or worsening symptoms  Otherwise see your doctor within 2 or 3 days for a recheck

## 2021-05-24 ENCOUNTER — Other Ambulatory Visit: Payer: Self-pay

## 2021-05-24 ENCOUNTER — Encounter (HOSPITAL_COMMUNITY): Payer: Self-pay

## 2021-05-24 ENCOUNTER — Emergency Department (HOSPITAL_COMMUNITY): Payer: Medicaid Other

## 2021-05-24 ENCOUNTER — Emergency Department (HOSPITAL_COMMUNITY)
Admission: EM | Admit: 2021-05-24 | Discharge: 2021-05-24 | Disposition: A | Discharge status: Home / Self Care | Payer: Medicaid Other | Attending: Emergency Medicine | Admitting: Emergency Medicine

## 2021-05-24 DIAGNOSIS — R0981 Nasal congestion: Secondary | ICD-10-CM | POA: Insufficient documentation

## 2021-05-24 DIAGNOSIS — R051 Acute cough: Secondary | ICD-10-CM | POA: Insufficient documentation

## 2021-05-24 DIAGNOSIS — R0602 Shortness of breath: Secondary | ICD-10-CM | POA: Insufficient documentation

## 2021-05-24 MED ORDER — DOXYCYCLINE HYCLATE 100 MG PO CAPS: 100.0000 mg | ORAL_CAPSULE | Freq: Two times a day (BID) | ORAL | 0 refills | Status: DC

## 2021-05-24 NOTE — Discharge Instructions (Signed)
Drink plenty of fluids.  Take Tylenol or Motrin for any aches and pains and follow-up with your doctor next week if not improving ?

## 2021-05-24 NOTE — ED Triage Notes (Signed)
Patient states she has been having sore throat, cough, chills and headache with congestion for 6 days. Tested herself for covid yesterday and it was negative.  ?

## 2021-05-24 NOTE — ED Provider Notes (Signed)
?[facility] EMERGENCY DEPARTMENT ?Provider Note ? ? ?CSN: [id number] ?Arrival date & time: 05/24/21  2683 ? ?  ? ?History ? ?Chief Complaint  ?Patient presents with  ? URI  ? ? ?Nancy Russell is a 31 y.o. female. ? ?Patient complains of cough and congestion.  No past medical history ? ?The history is provided by the patient and medical records. No language interpreter was used.  ?URI ?Presenting symptoms: congestion and cough   ?Presenting symptoms: no fatigue   ?Severity:  Mild ?Onset quality:  Sudden ?Timing:  Constant ?Progression:  Waxing and waning ?Chronicity:  New ?Relieved by:  Nothing ?Worsened by:  Nothing ?Ineffective treatments:  None tried ?Associated symptoms: no arthralgias and no headaches   ? ?  ? ?Home Medications ?Prior to Admission medications   ?Medication Sig Start Date End Date Taking? Authorizing Provider  ?doxycycline (VIBRAMYCIN) 100 MG capsule Take 1 capsule (100 mg total) by mouth 2 (two) times daily. One po bid x 7 days 05/24/21  Yes Bethann Berkshire, MD  ?   ? ?Allergies    ?Patient has no known allergies.   ? ?Review of Systems   ?Review of Systems  ?Constitutional:  Negative for appetite change and fatigue.  ?HENT:  Positive for congestion. Negative for ear discharge and sinus pressure.   ?Eyes:  Negative for discharge.  ?Respiratory:  Positive for cough.   ?Cardiovascular:  Negative for chest pain.  ?Gastrointestinal:  Negative for abdominal pain and diarrhea.  ?Genitourinary:  Negative for frequency and hematuria.  ?Musculoskeletal:  Negative for arthralgias and back pain.  ?Skin:  Negative for rash.  ?Neurological:  Negative for seizures and headaches.  ?Psychiatric/Behavioral:  Negative for hallucinations.   ? ?Physical Exam ?Updated Vital Signs ?BP 129/85   Pulse 73   Temp 98.5 ?F (36.9 ?C) (Oral)   Resp 18   Ht 5\' 5"  (1.651 m)   Wt 90 kg   SpO2 99%   BMI 33.02 kg/m?  ?Physical Exam ?Vitals and nursing note reviewed.  ?Constitutional:   ?   Appearance: She is well-developed.   ?HENT:  ?   Head: Normocephalic.  ?   Nose: Nose normal.  ?Eyes:  ?   General: No scleral icterus. ?   Conjunctiva/sclera: Conjunctivae normal.  ?Neck:  ?   Thyroid: No thyromegaly.  ?Cardiovascular:  ?   Rate and Rhythm: Normal rate and regular rhythm.  ?   Heart sounds: No murmur heard. ?  No friction rub. No gallop.  ?Pulmonary:  ?   Breath sounds: No stridor. No wheezing or rales.  ?Chest:  ?   Chest wall: No tenderness.  ?Abdominal:  ?   General: There is no distension.  ?   Tenderness: There is no abdominal tenderness. There is no rebound.  ?Musculoskeletal:     ?   General: Normal range of motion.  ?   Cervical back: Neck supple.  ?Lymphadenopathy:  ?   Cervical: No cervical adenopathy.  ?Skin: ?   Findings: No erythema or rash.  ?Neurological:  ?   Mental Status: She is alert and oriented to person, place, and time.  ?   Motor: No abnormal muscle tone.  ?   Coordination: Coordination normal.  ?Psychiatric:     ?   Behavior: Behavior normal.  ? ? ?ED Results / Procedures / Treatments   ?Labs ?(all labs ordered are listed, but only abnormal results are displayed) ?Labs Reviewed - No data to display ? ?EKG ?None ? ?Radiology ?  DG Chest 2 View ? ?Result Date: 05/24/2021 ?CLINICAL DATA:  Cough and shortness of breath for 6 days. EXAM: CHEST - 2 VIEW COMPARISON:  06/15/2020 FINDINGS: The cardiac silhouette, mediastinal and hilar contours are normal. The lungs are clear. No pleural effusions or pulmonary lesions. The bony thorax is intact. IMPRESSION: No acute cardiopulmonary findings. Electronically Signed   By: Rudie Meyer M.D.   On: 05/24/2021 09:14   ? ?Procedures ?Procedures  ? ? ?Medications Ordered in ED ?Medications - No data to display ? ?ED Course/ Medical Decision Making/ A&P ?  ?                        ?Medical Decision Making ?Amount and/or Complexity of Data Reviewed ?Radiology: ordered. ? ?Risk ?Prescription drug management. ? ?This patient presents to the ED for concern of cough, this involves an  extensive number of treatment options, and is a complaint that carries with it a high risk of complications and morbidity.  The differential diagnosis includes pneumonia, bronchitis ? ? ?Co morbidities that complicate the patient evaluation ? ?None ? ? ?Additional history obtained: ? ?Additional history obtained from patient ?External records from outside source obtained and reviewed including hospital records ? ? ?Lab Tests: ? ?No labs ? ?Imaging Studies ordered: ? ?I ordered imaging studies including chest x-ray ?I independently visualized and interpreted imaging which showed negative ?I agree with the radiologist interpretation ? ? ?Cardiac Monitoring: / EKG: ? ?The patient was maintained on a cardiac monitor.  I personally viewed and interpreted the cardiac monitored which showed an underlying rhythm of: Normal sinus rhythm ? ? ?Consultations Obtained: ? ?No consult ?Problem List / ED Course / Critical interventions / Medication management ? ?Cough no medicines given ?Reevaluation of the patient after these medicines showed that the patient stayed the same ?I have reviewed the patients home medicines and have made adjustments as needed ? ? ?Social Determinants of Health: ? ?None none ? ? ?Test / Admission - Considered: ? ?COVID ? ?X-ray unremarkable.  Patient given doxycycline and will follow-up with PCP ? ? ? ? ? ? ? ?Final Clinical Impression(s) / ED Diagnoses ?Final diagnoses:  ?Acute cough  ? ? ?Rx / DC Orders ?ED Discharge Orders   ? ?      Ordered  ?  doxycycline (VIBRAMYCIN) 100 MG capsule  2 times daily       ? 05/24/21 1104  ? ?  ?  ? ?  ? ? ?  ?Bethann Berkshire, MD ?05/24/21 1824 ? ?

## 2022-01-26 ENCOUNTER — Ambulatory Visit: Admit: 2022-01-26 | Payer: Medicaid Other

## 2022-01-26 DIAGNOSIS — J209 Acute bronchitis, unspecified: Secondary | ICD-10-CM | POA: Diagnosis not present

## 2022-01-26 DIAGNOSIS — J111 Influenza due to unidentified influenza virus with other respiratory manifestations: Secondary | ICD-10-CM | POA: Diagnosis not present

## 2022-03-06 LAB — CYTOLOGY - PAP

## 2022-03-15 ENCOUNTER — Encounter: Payer: Self-pay | Admitting: *Deleted

## 2022-03-17 ENCOUNTER — Encounter: Payer: Self-pay | Admitting: Advanced Practice Midwife

## 2022-03-17 ENCOUNTER — Ambulatory Visit (INDEPENDENT_AMBULATORY_CARE_PROVIDER_SITE_OTHER): Payer: Medicaid Other | Admitting: Advanced Practice Midwife

## 2022-03-17 VITALS — BP 114/73 | HR 55 | Ht 65.0 in | Wt 182.0 lb

## 2022-03-17 DIAGNOSIS — Z30430 Encounter for insertion of intrauterine contraceptive device: Secondary | ICD-10-CM | POA: Diagnosis not present

## 2022-03-17 LAB — POCT URINE PREGNANCY: Preg Test, Ur: NEGATIVE

## 2022-03-17 MED ORDER — LEVONORGESTREL 20 MCG/DAY IU IUD
1.0000 | INTRAUTERINE_SYSTEM | Freq: Once | INTRAUTERINE | Status: AC
  Administered 2022-03-17: 1 via INTRAUTERINE

## 2022-03-17 NOTE — Progress Notes (Unsigned)
   IUD INSERTION Patient name: Nancy Russell MRN [id number]  Date of birth: [birth year] Subjective Findings:   Nancy Russell is a 32 y.o. G98P0102 Caucasian female being seen today for insertion of a Mirena IUD.      No data to display          No LMP recorded. (Menstrual status: IUD). Last sexual intercourse was prior to removal of previous IUD which was on 03/06/22  Last pap2/5/24. Results were:  normal  The risks and benefits of the method and placement have been thouroughly reviewed with the patient and all questions were answered.  Specifically the patient is aware of failure rate of 01/998, expulsion of the IUD and of possible perforation.  The patient is aware of irregular bleeding due to the method and understands the incidence of irregular bleeding diminishes with time.  Signed copy of informed consent in chart.  Pertinent History Reviewed:   Reviewed past medical,surgical, social, obstetrical and family history.  Reviewed problem list, medications and allergies. Objective Findings & Procedure:   Vitals:   03/17/22 1046  BP: 114/73  Pulse: (!) 55  Weight: 182 lb (82.6 kg)  Height: 5' 5"$  (1.651 m)  Body mass index is 30.29 kg/m.  Results for orders placed or performed in visit on 03/17/22 (from the past 24 hour(s))  POCT urine pregnancy   Collection Time: 03/17/22 10:50 AM  Result Value Ref Range   Preg Test, Ur Negative Negative     Time out was performed.  A Graves speculum was placed in the vagina.  The cervix was visualized, prepped using Betadine, and grasped with a single tooth tenaculum. The uterus was found to be neutral and it sounded to 7 cm.  Mirena  IUD placed per manufacturer's recommendations. The strings were trimmed to approximately 3 cm. The patient tolerated the procedure well.   Informal transvaginal sonogram was performed and the proper placement of the IUD was verified.  Chaperone:  Celene Squibb    Assessment & Plan:   1) Mirena IUD  insertion The patient was given post procedure instructions, including signs and symptoms of infection and to check for the strings after each menses or each month, and refraining from intercourse or anything in the vagina for 3 days. She was given a care card with date IUD placed, and date IUD to be removed. She is scheduled for a f/u appointment in 4 weeks.  Orders Placed This Encounter  Procedures   POCT urine pregnancy    Return in about 4 weeks (around 04/14/2022) for IUD f/u.  Myrtis Ser CNM 03/17/2022 11:31 AM

## 2022-03-17 NOTE — Patient Instructions (Signed)
Nothing in vagina for 3 days (no sex, douching, tampons, etc...) Check your strings once a month to make sure you can feel them, if you are not able to please let us know If you develop a fever of 100.4 or more in the next few weeks, or if you develop severe abdominal pain, please let [place] know Use a backup method of birth control, such as condoms, for 2 weeks

## 2022-04-14 ENCOUNTER — Ambulatory Visit: Payer: Medicaid Other | Admitting: Adult Health

## 2022-07-25 DIAGNOSIS — Z903 Acquired absence of stomach [part of]: Secondary | ICD-10-CM | POA: Insufficient documentation

## 2023-01-05 ENCOUNTER — Ambulatory Visit: Payer: Medicaid Other | Admitting: Adult Health

## 2023-01-05 ENCOUNTER — Encounter: Payer: Self-pay | Admitting: Adult Health

## 2023-01-05 ENCOUNTER — Other Ambulatory Visit (HOSPITAL_COMMUNITY)
Admission: RE | Admit: 2023-01-05 | Discharge: 2023-01-05 | Disposition: A | Discharge status: Home / Self Care | Payer: Medicaid Other | Source: Ambulatory Visit | Attending: Adult Health | Admitting: Adult Health

## 2023-01-05 VITALS — BP 117/76 | HR 77 | Ht 65.0 in | Wt 163.0 lb

## 2023-01-05 DIAGNOSIS — Z113 Encounter for screening for infections with a predominantly sexual mode of transmission: Secondary | ICD-10-CM | POA: Insufficient documentation

## 2023-01-05 DIAGNOSIS — Z975 Presence of (intrauterine) contraceptive device: Secondary | ICD-10-CM | POA: Diagnosis not present

## 2023-01-05 DIAGNOSIS — N930 Postcoital and contact bleeding: Secondary | ICD-10-CM | POA: Insufficient documentation

## 2023-01-05 DIAGNOSIS — Z3202 Encounter for pregnancy test, result negative: Secondary | ICD-10-CM

## 2023-01-05 DIAGNOSIS — R109 Unspecified abdominal pain: Secondary | ICD-10-CM | POA: Diagnosis not present

## 2023-01-05 LAB — POCT URINE PREGNANCY: Preg Test, Ur: NEGATIVE

## 2023-01-05 NOTE — Progress Notes (Signed)
  Subjective:     Patient ID: Nancy Russell, female   DOB: 12/31/1990, 32 y.o.   MRN: [id number]  HPI Nancy Russell is a 32 year old white female, single, G1P0102 in complaining of bleeding after sex and pain in stomach after sex, esp right side. Does have new partner in last few months.  Last pap was NILM 03/06/22.   Review of Systems +bleeding after sex and pain in stomach after sex, esp right side. Does have new partner in last few months. Reviewed past medical,surgical, social and family history. Reviewed medications and allergies.     Objective:   Physical Exam BP 117/76 (BP Location: Left Arm, Patient Position: Sitting, Cuff Size: Normal)   Pulse 77   Ht 5\' 5"  (1.651 m)   Wt 163 lb (73.9 kg)   BMI 27.12 kg/m  UPT is negative Skin warm and dry.Pelvic: external genitalia is normal in appearance no lesions, vagina: scant discharge without odor,urethra has no lesions or masses noted, cervix:smooth and bulbous,+IUD strings at os, uterus: normal size, shape and contour, non tender, no masses felt, adnexa: no masses or tenderness noted. Bladder is non tender and no masses felt. CV swab obtained. Fall risk is low  Upstream - 01/05/23 0903       Pregnancy Intention Screening   Does the patient want to become pregnant in the next year? No    Does the patient's partner want to become pregnant in the next year? No    Would the patient like to discuss contraceptive options today? No      Contraception Wrap Up   Current Method IUD or IUS    End Method IUD or IUS    Contraception Counseling Provided Yes                Assessment:     1. Pregnancy examination or test, negative result - POCT urine pregnancy  2. IUD (intrauterine device) in place Mirena placed 03/17/22  3. Postcoital bleeding +bleeding after sex Try different positions CV swab sent - Cervicovaginal ancillary only( [place])  4. Stomach pain Has pain after sex at times  5. Screening examination for STD (sexually  transmitted disease) CV swab sent for GC/CHL,trich,BV and yeast Check HIV and PRp - Cervicovaginal ancillary only( [place]) - HIV Antibody (routine testing w rflx) - RPR     Plan:     Follow up prn

## 2023-01-06 LAB — HIV ANTIBODY (ROUTINE TESTING W REFLEX): HIV Screen 4th Generation wRfx: NONREACTIVE

## 2023-01-06 LAB — RPR: RPR Ser Ql: NONREACTIVE

## 2023-01-08 ENCOUNTER — Other Ambulatory Visit: Payer: Self-pay | Admitting: Adult Health

## 2023-01-08 LAB — CERVICOVAGINAL ANCILLARY ONLY
Bacterial Vaginitis (gardnerella): POSITIVE — AB
Candida Glabrata: NEGATIVE
Candida Vaginitis: POSITIVE — AB
Chlamydia: NEGATIVE
Comment: NEGATIVE
Comment: NEGATIVE
Comment: NEGATIVE
Comment: NEGATIVE
Comment: NEGATIVE
Comment: NORMAL
Neisseria Gonorrhea: NEGATIVE
Trichomonas: NEGATIVE

## 2023-01-08 MED ORDER — METRONIDAZOLE 500 MG PO TABS: 500.0000 mg | ORAL_TABLET | Freq: Two times a day (BID) | ORAL | 0 refills | Status: DC

## 2023-01-08 MED ORDER — FLUCONAZOLE 150 MG PO TABS: ORAL_TABLET | ORAL | 1 refills | Status: DC

## 2023-01-08 NOTE — Progress Notes (Signed)
+  BV and yeast on vaginal swab, will rx flagyl and diflucan, no sex or alcohol while taking meds  

## 2023-03-12 ENCOUNTER — Other Ambulatory Visit: Payer: Medicaid Other | Admitting: *Deleted

## 2023-03-12 ENCOUNTER — Other Ambulatory Visit (HOSPITAL_COMMUNITY)
Admission: RE | Admit: 2023-03-12 | Discharge: 2023-03-12 | Disposition: A | Discharge status: Home / Self Care | Payer: Medicaid Other | Source: Ambulatory Visit | Attending: Obstetrics & Gynecology | Admitting: Obstetrics & Gynecology

## 2023-03-12 DIAGNOSIS — N898 Other specified noninflammatory disorders of vagina: Secondary | ICD-10-CM | POA: Diagnosis present

## 2023-03-12 DIAGNOSIS — R1032 Left lower quadrant pain: Secondary | ICD-10-CM | POA: Diagnosis not present

## 2023-03-12 DIAGNOSIS — R8290 Unspecified abnormal findings in urine: Secondary | ICD-10-CM | POA: Diagnosis not present

## 2023-03-12 LAB — POCT URINALYSIS DIPSTICK OB
Glucose, UA: NEGATIVE
Ketones, UA: NEGATIVE
Leukocytes, UA: NEGATIVE
Nitrite, UA: NEGATIVE

## 2023-03-12 NOTE — Progress Notes (Signed)
   NURSE VISIT- VAGINITIS/UTI symptoms  SUBJECTIVE:  Nancy Russell is a 33 y.o. [id number] GYN patientfemale here for a vaginal swab for vaginitis screening and urine check.  She reports the following symptoms: discharge described as brown  for 2 days along with "chemical smell" to urine and left lower quad pain. Denies abnormal vaginal bleeding, significant pelvic pain, fever.  OBJECTIVE:  There were no vitals taken for this visit.  Appears well, in no apparent distress  ASSESSMENT: Vaginal swab for vaginitis screening  PLAN: Self-collected vaginal probe for Gonorrhea, Chlamydia, Trichomonas, Bacterial Vaginosis, Yeast sent to lab Treatment: to be determined once results are received Follow-up as needed if symptoms persist/worsen, or new symptoms develop  Nancy Russell  03/12/2023 10:36 AM

## 2023-03-13 LAB — CERVICOVAGINAL ANCILLARY ONLY
Bacterial Vaginitis (gardnerella): NEGATIVE
Candida Glabrata: NEGATIVE
Candida Vaginitis: POSITIVE — AB
Chlamydia: NEGATIVE
Comment: NEGATIVE
Comment: NEGATIVE
Comment: NEGATIVE
Comment: NEGATIVE
Comment: NEGATIVE
Comment: NORMAL
Neisseria Gonorrhea: NEGATIVE
Trichomonas: NEGATIVE

## 2023-03-13 LAB — MICROSCOPIC EXAMINATION
Casts: NONE SEEN /[LPF]
Epithelial Cells (non renal): >10 /[HPF] — AB (ref 0–10)

## 2023-03-13 LAB — URINALYSIS, ROUTINE W REFLEX MICROSCOPIC
Bilirubin, UA: NEGATIVE
Glucose, UA: NEGATIVE
Ketones, UA: NEGATIVE
Leukocytes,UA: NEGATIVE
Nitrite, UA: NEGATIVE
Specific Gravity, UA: 1.024 (ref 1.005–1.030)
Urobilinogen, Ur: 1 mg/dL (ref 0.2–1.0)
pH, UA: 7.5 (ref 5.0–7.5)

## 2023-03-14 ENCOUNTER — Other Ambulatory Visit: Payer: Self-pay | Admitting: Adult Health

## 2023-03-14 MED ORDER — FLUCONAZOLE 150 MG PO TABS: ORAL_TABLET | ORAL | 1 refills | Status: DC

## 2023-03-14 NOTE — Progress Notes (Signed)
+  yeast on vaginal swab, will rx diflucan  ?

## 2023-03-16 ENCOUNTER — Other Ambulatory Visit: Payer: Self-pay | Admitting: Adult Health

## 2023-03-16 LAB — URINE CULTURE

## 2023-03-16 MED ORDER — NITROFURANTOIN MONOHYD MACRO 100 MG PO CAPS: 100.0000 mg | ORAL_CAPSULE | Freq: Two times a day (BID) | ORAL | 0 refills | Status: DC

## 2023-03-16 NOTE — Progress Notes (Signed)
Rx macrobid urine culture + E coli, push fluids

## 2023-04-09 ENCOUNTER — Encounter: Payer: Self-pay | Admitting: Adult Health

## 2023-04-09 ENCOUNTER — Other Ambulatory Visit (HOSPITAL_COMMUNITY)
Admission: RE | Admit: 2023-04-09 | Discharge: 2023-04-09 | Disposition: A | Discharge status: Home / Self Care | Source: Ambulatory Visit | Attending: Adult Health | Admitting: Adult Health

## 2023-04-09 ENCOUNTER — Ambulatory Visit: Admitting: Adult Health

## 2023-04-09 VITALS — BP 106/52 | HR 65 | Ht 65.0 in | Wt 170.0 lb

## 2023-04-09 DIAGNOSIS — N920 Excessive and frequent menstruation with regular cycle: Secondary | ICD-10-CM | POA: Diagnosis not present

## 2023-04-09 DIAGNOSIS — N898 Other specified noninflammatory disorders of vagina: Secondary | ICD-10-CM | POA: Diagnosis present

## 2023-04-09 DIAGNOSIS — Z975 Presence of (intrauterine) contraceptive device: Secondary | ICD-10-CM

## 2023-04-09 DIAGNOSIS — R319 Hematuria, unspecified: Secondary | ICD-10-CM | POA: Insufficient documentation

## 2023-04-09 LAB — POCT URINALYSIS DIPSTICK
Glucose, UA: NEGATIVE
Ketones, UA: NEGATIVE
Leukocytes, UA: NEGATIVE
Nitrite, UA: NEGATIVE
Protein, UA: NEGATIVE

## 2023-04-09 NOTE — Addendum Note (Signed)
 Addended by: Colen Darling on: 04/09/2023 11:58 AM   Modules accepted: Orders

## 2023-04-09 NOTE — Progress Notes (Signed)
  Subjective:     Patient ID: Nancy Russell, female   DOB: May 08, 1990, 33 y.o.   MRN: [id number]  HPI Nancy Russell is a 33 year old white female,single, G1P0102 in complaining of vaginal irritation, will burn at times and spots brown, no pain with sex.  Last pap was negative 03/06/22  Review of Systems +vaginal irritation, will burn at times and spots brown,  no pain with sex or urination Reviewed past medical,surgical, social and family history. Reviewed medications and allergies.     Objective:   Physical Exam BP (!) 106/52 (BP Location: Left Arm, Patient Position: Sitting, Cuff Size: Normal)   Pulse 65   Ht 5\' 5"  (1.651 m)   Wt 170 lb (77.1 kg)   BMI 28.29 kg/m  Urine dipstick was 1+ blood. Skin warm and dry.Pelvic: external genitalia is normal in appearance no lesions, vagina: tan discharge without odor,urethra has no lesions or masses noted, cervix:smooth, +IUD strings at os, uterus: normal size, shape and contour, non tender, no masses felt, adnexa: no masses or tenderness noted. Bladder is non tender and no masses felt. CV swab obtained.    Fall risk is low  Upstream - 04/09/23 1125       Pregnancy Intention Screening   Does the patient want to become pregnant in the next year? No    Does the patient's partner want to become pregnant in the next year? Yes    Would the patient like to discuss contraceptive options today? No      Contraception Wrap Up   Current Method IUD or IUS    End Method IUD or IUS    Contraception Counseling Provided Yes            Examination chaperoned by Malachy Mood LPN  Assessment:     1. Hematuria, unspecified type 1+ blood on dipstick Will get UA C&S to rule out UTI - POCT Urinalysis Dipstick  2. Vaginal irritation (Primary) CV swab sent for GC/CHL,trich,BV and yeast  - Cervicovaginal ancillary only( [place])  3. IUD (intrauterine device) in place Mirena placed 03/17/22  4. Spotting between menses CV swab sent for GC/CHL,trich, BV  and yeast  - Cervicovaginal ancillary only( [place])     Plan:     Follow up prn

## 2023-04-10 LAB — CERVICOVAGINAL ANCILLARY ONLY
Bacterial Vaginitis (gardnerella): NEGATIVE
Candida Glabrata: NEGATIVE
Candida Vaginitis: NEGATIVE
Chlamydia: NEGATIVE
Comment: NEGATIVE
Comment: NEGATIVE
Comment: NEGATIVE
Comment: NEGATIVE
Comment: NEGATIVE
Comment: NORMAL
Neisseria Gonorrhea: NEGATIVE
Trichomonas: NEGATIVE

## 2023-04-10 LAB — MICROSCOPIC EXAMINATION
Bacteria, UA: NONE SEEN
Casts: NONE SEEN /LPF
WBC, UA: NONE SEEN /HPF (ref 0–5)

## 2023-04-10 LAB — URINALYSIS, ROUTINE W REFLEX MICROSCOPIC
Bilirubin, UA: NEGATIVE
Glucose, UA: NEGATIVE
Ketones, UA: NEGATIVE
Leukocytes,UA: NEGATIVE
Nitrite, UA: NEGATIVE
Specific Gravity, UA: 1.023 (ref 1.005–1.030)
Urobilinogen, Ur: 1 mg/dL (ref 0.2–1.0)
pH, UA: 8.5 — ABNORMAL HIGH (ref 5.0–7.5)

## 2023-04-11 LAB — URINE CULTURE

## 2023-06-20 ENCOUNTER — Encounter: Payer: Self-pay | Admitting: Advanced Practice Midwife

## 2023-06-20 ENCOUNTER — Ambulatory Visit: Admitting: Advanced Practice Midwife

## 2023-06-20 ENCOUNTER — Other Ambulatory Visit (HOSPITAL_COMMUNITY)
Admission: RE | Admit: 2023-06-20 | Discharge: 2023-06-20 | Disposition: A | Discharge status: Home / Self Care | Source: Ambulatory Visit | Attending: Advanced Practice Midwife | Admitting: Advanced Practice Midwife

## 2023-06-20 VITALS — BP 115/75 | HR 56 | Ht 65.0 in | Wt 169.0 lb

## 2023-06-20 DIAGNOSIS — Z30432 Encounter for removal of intrauterine contraceptive device: Secondary | ICD-10-CM | POA: Diagnosis not present

## 2023-06-20 DIAGNOSIS — Z30430 Encounter for insertion of intrauterine contraceptive device: Secondary | ICD-10-CM

## 2023-06-20 DIAGNOSIS — N898 Other specified noninflammatory disorders of vagina: Secondary | ICD-10-CM | POA: Diagnosis present

## 2023-06-20 MED ORDER — NORETHIN ACE-ETH ESTRAD-FE 1-20 MG-MCG PO TABS: 1.0000 | ORAL_TABLET | Freq: Every day | ORAL | 11 refills | Status: AC

## 2023-06-20 NOTE — Progress Notes (Addendum)
   IUD REMOVAL  Patient name: Nancy Russell MRN [id number]  Date of birth: 03/03/1990 Subjective Findings:   Nancy Russell is a 33 y.o. G46P0102 Caucasian female being seen today for removal of a Mirena   IUD. Her IUD was placed Feb 2024.  She desires removal because since Dec 2024 she has been having recurrent vag infections (thinks has been BV); partner isn't new. Signed copy of informed consent in chart.  No LMP recorded. (Menstrual status: IUD). Last pap Feb 2024 . Results were: NILM w/ HRHPV negative The planned method of family planning is OCP (estrogen/progesterone)      No data to display               No data to display           Pertinent History Reviewed:   Reviewed past medical,surgical, social, obstetrical and family history.  Reviewed problem list, medications and allergies. Objective Findings & Procedure:    Vitals:   06/20/23 0833  BP: 115/75  Pulse: (!) 56  Weight: 169 lb (76.7 kg)  Height: 5\' 5"  (1.651 m)  Body mass index is 28.12 kg/m.  No results found for this or any previous visit (from the past 24 hours).   Time out was performed.  A Graves speculum was placed in the vagina.  The cervix was visualized, and the strings were visible. They were grasped and the Mirena   IUD was easily removed intact without complications. The patient tolerated the procedure well.   Chaperone: Lorean Rodes Assessment & Plan:   1) Mirena   IUD removal Follow-up prn problems  2) Vag d/c and odor, CV swab sent  3) New start on OCPs, condoms x 2 weeks; f/u in 3 mos  No orders of the defined types were placed in this encounter.   Follow-up: Return in about 3 months (around 09/20/2023) for contraceptive f/u.  Jolayne Natter CNM 06/20/2023 11:13 AM

## 2023-06-21 LAB — CERVICOVAGINAL ANCILLARY ONLY
Bacterial Vaginitis (gardnerella): NEGATIVE
Chlamydia: NEGATIVE
Comment: NEGATIVE
Comment: NEGATIVE
Comment: NEGATIVE
Comment: NORMAL
Neisseria Gonorrhea: NEGATIVE
Trichomonas: NEGATIVE

## 2023-06-26 ENCOUNTER — Ambulatory Visit: Payer: Self-pay | Admitting: Advanced Practice Midwife

## 2023-09-20 ENCOUNTER — Ambulatory Visit: Admitting: Adult Health

## 2023-09-24 ENCOUNTER — Other Ambulatory Visit: Payer: Self-pay

## 2023-09-24 ENCOUNTER — Ambulatory Visit
Admission: EM | Admit: 2023-09-24 | Discharge: 2023-09-24 | Disposition: A | Discharge status: Home / Self Care | Attending: Nurse Practitioner | Admitting: Nurse Practitioner

## 2023-09-24 DIAGNOSIS — J069 Acute upper respiratory infection, unspecified: Secondary | ICD-10-CM | POA: Diagnosis not present

## 2023-09-24 LAB — POC COVID19/FLU A&B COMBO
Covid Antigen, POC: NEGATIVE
Influenza A Antigen, POC: NEGATIVE
Influenza B Antigen, POC: NEGATIVE

## 2023-09-24 MED ORDER — PREDNISONE 20 MG PO TABS: 40.0000 mg | ORAL_TABLET | Freq: Every day | ORAL | 0 refills | Status: AC

## 2023-09-24 MED ORDER — FLUTICASONE PROPIONATE 50 MCG/ACT NA SUSP
2.0000 | Freq: Every day | NASAL | 0 refills | Status: DC
Start: 2023-09-24 — End: 2023-11-27

## 2023-09-24 MED ORDER — PROMETHAZINE-DM 6.25-15 MG/5ML PO SYRP: 5.0000 mL | ORAL_SOLUTION | Freq: Four times a day (QID) | ORAL | 0 refills | Status: DC | PRN

## 2023-09-24 NOTE — ED Provider Notes (Signed)
 [facility]    CSN: [id number] Arrival date & time: 09/24/23  9163      History   Chief Complaint Chief Complaint  Patient presents with   Cough   Chest Pain    HPI Nancy Russell is a 33 y.o. female.   The history is provided by the patient.   Patient presents with a 4-day history of generalized fatigue, body aches, headache, nasal congestion, runny nose, cough chest discomfort, and nausea.  Patient denies fever, chills, ear pain, ear drainage, wheezing, difficulty breathing, diarrhea, constipation, or rash.  Patient states so far, she has not taken any medication for her symptoms.  Patient reports that she does currently vape, but has not been vaping since her symptoms started.  Denies prior history of asthma or seasonal allergies.  Patient reports that she just returned from vacation.  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    Past Surgical History:  Procedure Laterality Date   ABDOMINAL SURGERY     gastrectomy   CESAREAN SECTION  01/05/2012   Procedure: CESAREAN SECTION;  Surgeon: Vonn VEAR Inch, MD;  Location: [facility];  Service: Obstetrics;  Laterality: N/A;   CESAREAN SECTION     CYST EXCISION N/A 01/17/2016   Procedure: EXCISION 3CM CYST ON CHEST WALL;  Surgeon: Oneil Budge, MD;  Location: [facility];  Service: General;  Laterality:  N/A;   CYSTECTOMY  at age 40   cyst removed from my chest    ESOPHAGOGASTRODUODENOSCOPY  03/15/2015   STOMACH SURGERY      OB History     Gravida  1   Para  1   Term  0   Preterm  1   AB  0   Living  2      SAB  0   IAB  0   Ectopic  0   Multiple  1   Live Births  2            Home Medications    Prior to Admission medications   Medication Sig Start Date End Date Taking? Authorizing Provider  fluticasone  (FLONASE ) 50 MCG/ACT nasal spray Place 2 sprays into both nostrils daily. 09/24/23  Yes Leath-Warren, Etta PARAS, NP  predniSONE  (DELTASONE ) 20 MG tablet Take 2 tablets (40 mg total) by mouth daily with breakfast for 5 days. 09/24/23 09/29/23 Yes Leath-Warren, Etta PARAS, NP  promethazine -dextromethorphan (PROMETHAZINE -DM) 6.25-15 MG/5ML syrup Take 5 mLs by mouth 4 (four) times daily as needed. 09/24/23  Yes Leath-Warren, Etta PARAS, NP  Cholecalciferol 1.25 MG (50000 UT) capsule Take 1 capsule by mouth once a week. 03/01/22   [provider]  cyanocobalamin (VITAMIN B12) 1000 MCG/ML injection Every 2 weeks 03/01/22   [provider]  norethindrone-ethinyl estradiol-FE (JUNEL FE 1/20) 1-20 MG-MCG tablet Take 1 tablet by mouth daily.  06/20/23   Loreli Suzen BIRCH, CNM    Family History Family History  Problem Relation Age of Onset   Anesthesia problems Neg Hx    Hypotension Neg Hx    Malignant hyperthermia Neg Hx    Pseudochol deficiency Neg Hx    Other Neg Hx     Social History Social History   Tobacco Use   Smoking status: Never   Smokeless tobacco: Never  Vaping Use   Vaping status: Every Day  Substance Use Topics   Alcohol use: No   Drug use: No     Allergies   Patient has no known allergies.   Review of Systems Review of Systems Per HPI  Physical Exam Triage Vital Signs ED Triage Vitals [09/24/23 0847]  Encounter Vitals Group     BP 116/84     Girls Systolic BP Percentile      Girls Diastolic BP Percentile      Boys  Systolic BP Percentile      Boys Diastolic BP Percentile      Pulse Rate 85     Resp 18     Temp 98 F (36.7 C)     Temp Source Oral     SpO2 99 %     Weight      Height      Head Circumference      Peak Flow      Pain Score 7     Pain Loc      Pain Education      Exclude from Growth Chart    No data found.  Updated Vital Signs BP 116/84 (BP Location: Right Arm)   Pulse 85   Temp 98 F (36.7 C) (Oral)   Resp 18   LMP 09/17/2023 (Approximate)   SpO2 99%   Visual Acuity Right Eye Distance:   Left Eye Distance:   Bilateral Distance:    Right Eye Near:   Left Eye Near:    Bilateral Near:     Physical Exam Vitals and nursing note reviewed.  Constitutional:      General: She is not in acute distress.    Appearance: She is well-developed.  HENT:     Head: Normocephalic.     Right Ear: Tympanic membrane, ear canal and external ear normal.     Left Ear: Tympanic membrane, ear canal and external ear normal.     Nose: Congestion present.     Right Turbinates: Enlarged and swollen.     Left Turbinates: Enlarged and swollen.     Right Sinus: No maxillary sinus tenderness or frontal sinus tenderness.     Left Sinus: No maxillary sinus tenderness or frontal sinus tenderness.     Mouth/Throat:     Lips: Pink.     Mouth: Mucous membranes are moist.     Pharynx: Posterior oropharyngeal erythema and postnasal drip present. No pharyngeal swelling, oropharyngeal exudate or uvula swelling.     Comments: Cobblestoning present to posterior oropharynx  Eyes:     Extraocular Movements: Extraocular movements intact.     Conjunctiva/sclera: Conjunctivae normal.     Pupils: Pupils are equal, round, and reactive to light.  Cardiovascular:     Rate and Rhythm: Normal rate and regular rhythm.     Pulses: Normal pulses.     Heart sounds: Normal heart sounds.  Pulmonary:     Effort: Pulmonary effort is normal. No respiratory distress.     Breath sounds: Normal breath sounds. No  stridor. No  wheezing, rhonchi or rales.  Abdominal:     General: Bowel sounds are normal.     Palpations: Abdomen is soft.     Tenderness: There is no abdominal tenderness.  Musculoskeletal:     Cervical back: Normal range of motion.  Skin:    General: Skin is warm and dry.  Neurological:     General: No focal deficit present.     Mental Status: She is alert and oriented to person, place, and time.  Psychiatric:        Mood and Affect: Mood normal.        Behavior: Behavior normal.      UC Treatments / Results  Labs (all labs ordered are listed, but only abnormal results are displayed) Labs Reviewed  POC COVID19/FLU A&B COMBO - Normal    EKG: Sinus bradycardia, no ectopy, no STEMI.  Compared to EKGs dated 04/16/2020, 10/01/2019, and 09/12/2012.   Radiology No results found.  Procedures Procedures (including critical care time)  Medications Ordered in UC Medications - No data to display  Initial Impression / Assessment and Plan / UC Course  I have reviewed the triage vital signs and the nursing notes.  Pertinent labs & imaging results that were available during my care of the patient were reviewed by me and considered in my medical decision making (see chart for details).  The COVID/flu test was negative.  The patient is well-appearing, she is in no acute distress, vital signs are stable.  Room air sats at 99%.  Symptoms are consistent with a viral URI with cough, although there is concern for acute bronchitis.  Will provide symptomatic treatment with prednisone  40 mg, Promethazine  DM for the cough, and fluticasone  50 mcg nasal spray.  Supportive care recommendations were provided and discussed with the patient to include fluids, rest, over-the-counter analgesics, use of a humidifier during sleep, and to discontinue vaping while symptoms persist.  Discussed indications with patient regarding follow-up.  Patient was in agreement with this plan of care and verbalizes  understanding.  All questions were answered.  Patient stable for discharge.  Work note was provided.  Final Clinical Impressions(s) / UC Diagnoses   Final diagnoses:  Viral URI with cough     Discharge Instructions      The COVID/flu test was negative. Take medication as prescribed. Increase fluids and allow for plenty of rest. You may take over-the-counter Tylenol  or ibuprofen  as needed for pain, fever, or general discomfort. You may use normal saline nasal spray throughout the day for nasal congestion and runny nose. For your cough, recommend use of a humidifier in your bedroom at nighttime during sleep and sleeping elevated on pillows while symptoms persist. Also recommend that you stop vaping while symptoms persist. Symptoms should improve over the next 5 to 7 days.  Follow-up if you develop new symptoms such as fever, chills, wheezing, difficulty breathing, or other concerns. Follow-up as needed.     ED Prescriptions     Medication Sig Dispense Auth. Provider   promethazine -dextromethorphan (PROMETHAZINE -DM) 6.25-15 MG/5ML syrup Take 5 mLs by mouth 4 (four) times daily as needed. 118 mL Leath-Warren, Etta PARAS, NP   predniSONE  (DELTASONE ) 20 MG tablet Take 2 tablets (40 mg total) by mouth daily with breakfast for 5 days. 10 tablet Leath-Warren, Etta PARAS, NP   fluticasone  (FLONASE ) 50 MCG/ACT nasal spray Place 2 sprays into both nostrils daily. 16 g Leath-Warren, Etta PARAS, NP      PDMP not reviewed this encounter.   Leath-Warren,  Etta PARAS, NP 09/24/23 0930

## 2023-09-24 NOTE — ED Triage Notes (Signed)
 Pt states she started having chest pain and burning pain in left arm that comes and goes x 4 days, as well as cough, body aches, headache. Not taking any OTC medication at this time.

## 2023-09-24 NOTE — Discharge Instructions (Addendum)
 The COVID/flu test was negative. Take medication as prescribed. Increase fluids and allow for plenty of rest. You may take over-the-counter Tylenol  or ibuprofen  as needed for pain, fever, or general discomfort. You may use normal saline nasal spray throughout the day for nasal congestion and runny nose. For your cough, recommend use of a humidifier in your bedroom at nighttime during sleep and sleeping elevated on pillows while symptoms persist. Also recommend that you stop vaping while symptoms persist. Symptoms should improve over the next 5 to 7 days.  Follow-up if you develop new symptoms such as fever, chills, wheezing, difficulty breathing, or other concerns. Follow-up as needed.

## 2023-11-20 ENCOUNTER — Encounter (INDEPENDENT_AMBULATORY_CARE_PROVIDER_SITE_OTHER): Payer: Self-pay | Admitting: *Deleted

## 2023-11-27 ENCOUNTER — Ambulatory Visit (INDEPENDENT_AMBULATORY_CARE_PROVIDER_SITE_OTHER): Admitting: Gastroenterology

## 2023-11-27 ENCOUNTER — Encounter (INDEPENDENT_AMBULATORY_CARE_PROVIDER_SITE_OTHER): Payer: Self-pay | Admitting: Gastroenterology

## 2023-11-27 ENCOUNTER — Telehealth (INDEPENDENT_AMBULATORY_CARE_PROVIDER_SITE_OTHER): Payer: Self-pay

## 2023-11-27 VITALS — BP 104/69 | HR 59 | Temp 98.0°F | Ht 65.0 in | Wt 170.4 lb

## 2023-11-27 DIAGNOSIS — Z9884 Bariatric surgery status: Secondary | ICD-10-CM

## 2023-11-27 DIAGNOSIS — E538 Deficiency of other specified B group vitamins: Secondary | ICD-10-CM | POA: Diagnosis not present

## 2023-11-27 DIAGNOSIS — K921 Melena: Secondary | ICD-10-CM | POA: Diagnosis not present

## 2023-11-27 MED ORDER — SUTAB 1479-225-188 MG PO TABS: ORAL_TABLET | ORAL | 0 refills | Status: AC

## 2023-11-27 NOTE — Telephone Encounter (Signed)
 Spoke with patient in person, scheduled TCS for 12/13/2023 at 10:15am. Rx sent to pharmacy. Instructions given to patient in office.

## 2023-11-27 NOTE — Progress Notes (Signed)
 Mileah Hemmer Faizan Bina Veenstra , M.D. Gastroenterology & Hepatology [facility] Gastroenterology [address] [place], [place] [address] Primary Care Physician: Suanne Pfeiffer, NP [address] [place] [address]  Chief Complaint: Blood per rectum  History of Present Illness: Nancy Russell is a 33 y.o. 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  Patient has 1 bowel movement daily without any straining on Bristol stool scale type IV-V .Patient is not on phentermine or GLP1 -agonist  The patient denies having any nausea, vomiting, fever, chills,  melena, hematemesis, abdominal distention, abdominal pain, diarrhea, jaundice, pruritus or weight loss.  Last ZHI:ilxz 2017, can't open the report - preoperative   Portion of stomach with no significant histopathologic abnormality. No Helicobacter pylori organisms are seen on routine H&E stain. Negative for malignancy.  Last Colonoscopy:none  FHx: neg for any gastrointestinal/liver disease, no malignancies Social: neg smoking, alcohol or illicit drug use Surgical: Sleeve gastrectomy  Past Medical History: 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, 2017   Nausea with vomiting    Nexplanon in place 02/12/2015   Nexplanon removal 02/26/2015   Vaginal Pap smear, abnormal     Past Surgical History: Past Surgical History:  Procedure Laterality Date   ABDOMINAL SURGERY     gastrectomy   CESAREAN SECTION  01/05/2012   Procedure: CESAREAN SECTION;  Surgeon: Vonn VEAR Inch, MD;  Location: [facility];  Service: Obstetrics;  Laterality: N/A;   CESAREAN SECTION     CYST  EXCISION N/A 01/17/2016   Procedure: EXCISION 3CM CYST ON CHEST WALL;  Surgeon: Oneil Budge, MD;  Location: [facility];  Service: General;  Laterality: N/A;   CYSTECTOMY  at age 40   cyst removed from my chest    ESOPHAGOGASTRODUODENOSCOPY  03/15/2015   STOMACH SURGERY      Family History: Family History  Problem Relation Age of Onset   Anesthesia problems Neg Hx    Hypotension Neg Hx    Malignant hyperthermia Neg Hx    Pseudochol deficiency Neg Hx    Other Neg Hx     Social History: Social History   Tobacco Use  Smoking Status Never  Smokeless Tobacco Never   Social History   Substance and Sexual Activity  Alcohol Use No   Social History   Substance and Sexual Activity  Drug Use No    Allergies: No Known Allergies  Medications: Current Outpatient Medications  Medication Sig Dispense Refill   Cholecalciferol 1.25 MG (50000 UT) capsule Take 1 capsule by mouth once a week.     cyanocobalamin (VITAMIN B12) 1000 MCG/ML injection Every 2 weeks     norethindrone-ethinyl estradiol-FE (JUNEL FE 1/20) 1-20 MG-MCG tablet Take 1 tablet by mouth daily. 28 tablet 11   No current facility-administered medications for this visit.    Review of Systems: GENERAL: negative for malaise, night sweats HEENT: No changes in hearing or vision, no nose bleeds or other nasal problems. NECK: Negative for lumps, goiter, pain and significant neck swelling RESPIRATORY: Negative for cough, wheezing CARDIOVASCULAR: Negative for chest pain, leg swelling, palpitations, orthopnea GI: SEE HPI MUSCULOSKELETAL: Negative for joint  pain or swelling, back pain, and muscle pain. SKIN: Negative for lesions, rash HEMATOLOGY Negative for prolonged bleeding, bruising easily, and swollen nodes. ENDOCRINE: Negative for cold or heat intolerance, polyuria, polydipsia and goiter. NEURO: negative for tremor, gait imbalance, syncope and seizures. The remainder of the review of systems is  noncontributory.   Physical Exam: BP 104/69   Pulse (!) 59   Temp 98 F (36.7 C)   Ht 5' 5 (1.651 m)   Wt 170 lb 6.4 oz (77.3 kg)   LMP 11/19/2023 (Approximate)   BMI 28.36 kg/m  GENERAL: The patient is AO x3, in no acute distress. HEENT: Head is normocephalic and atraumatic. EOMI are intact. Mouth is well hydrated and without lesions. NECK: Supple. No masses LUNGS: Clear to auscultation. No presence of rhonchi/wheezing/rales. Adequate chest expansion HEART: RRR, normal s1 and s2. ABDOMEN: Soft, nontender, no guarding, no peritoneal signs, and nondistended. BS +. No masses.   Imaging/Labs: as above     Latest Ref Rng & Units 09/30/2019    8:45 PM 01/12/2016   12:46 PM 06/13/2015    6:26 AM  CBC  WBC 4.0 - 10.5 K/uL 6.2  7.1  8.6   Hemoglobin 12.0 - 15.0 g/dL 87.0  86.2  86.9   Hematocrit 36.0 - 46.0 % 38.5  41.0  37.6   Platelets 150 - 400 K/uL 246  294  306    No results found for: IRON, TIBC, FERRITIN  I personally reviewed and interpreted the available labs, imaging and endoscopic files.  03/2022   1. Soft tissue focus along the midline adjacent to the rectus musculature measuring about 1.7 cm. This is indeterminate and could be due to benign or malignant neoplasm, an inflammatory/infectious etiology, or less likely small hematoma.  2. Cholelithiasis without cholecystitis.   Path: 1.  Abdominal anterior wall soft tissue, needle core biopsy: - Benign soft tissue with fibroelastosis, hemosiderin, and fat necrosis. - Negative for malignancy. - An immunostain for pancytokeratin is negative with adequate controls.  Impression and Plan:  Nancy Russell is a 33 y.o. 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 Patient understands what the procedure involves including the benefits and any risks. Patient understands alternatives to the proposed procedure. Risks including (but not limited to) bleeding, tearing of the lining (perforation), rupture of adjacent organs, problems with heart and lung function, infection, and medication reactions. A small percentage of complications may require surgery, hospitalization, repeat endoscopic procedure, and/or transfusion.  Patient understood and agreed.   #Vitamin B12 deficiency  Patient has a documented history of vitamin B12 deficiency on supplementation.  She is not vegan  I recommend checking for intrinsic factor antibody and antiparietal antibody to ensure if there is any pernicious anemia, with next set of blood work  If above is positive may consider upper endoscopy as pernicious anemia is considered gastric premalignant condition All questions were answered.      Nancy Russell Faizan Sadiyah Kangas, MD Gastroenterology and Hepatology [facility] Gastroenterology   This chart has been completed using [facility] Dictation software, and while attempts have been made to ensure accuracy , certain words and phrases may not be transcribed as intended

## 2023-11-27 NOTE — Patient Instructions (Signed)
It was very nice to meet you today, as dicussed with will plan for the following :  1) Colonoscopy

## 2023-11-27 NOTE — Telephone Encounter (Signed)
 PA on availity with [employer] for TCS: Procedure Code 1: No authorization is required for this code unless this request is related to services over the maximum benefit. If this auth request is related to services over benefit limits, please submit your request directly to your health plan.  PA on [employer] for TCS: Prior Authorization not required.

## 2023-12-05 ENCOUNTER — Telehealth: Payer: Self-pay | Admitting: *Deleted

## 2023-12-05 DIAGNOSIS — K921 Melena: Secondary | ICD-10-CM

## 2023-12-05 MED ORDER — PEG 3350-KCL-NA BICARB-NACL 420 G PO SOLR: 4000.0000 mL | Freq: Once | ORAL | 0 refills | Status: AC

## 2023-12-05 NOTE — Telephone Encounter (Signed)
 Pt called in to reschedule her procedure from 11/13. She has been rescheduled to 12/1. Also she reports her instructions will not pay for the pill prep so she needs the liquid sent in. Advised will do so and send new instructions.

## 2023-12-10 ENCOUNTER — Encounter (HOSPITAL_COMMUNITY)

## 2023-12-25 ENCOUNTER — Encounter (HOSPITAL_COMMUNITY): Payer: Self-pay

## 2023-12-25 ENCOUNTER — Encounter (HOSPITAL_COMMUNITY)
Admission: RE | Admit: 2023-12-25 | Discharge: 2023-12-25 | Disposition: A | Discharge status: Home / Self Care | Source: Ambulatory Visit | Attending: Gastroenterology | Admitting: Gastroenterology

## 2023-12-25 ENCOUNTER — Other Ambulatory Visit: Payer: Self-pay

## 2023-12-31 ENCOUNTER — Encounter (HOSPITAL_COMMUNITY): Payer: Self-pay | Admitting: Gastroenterology

## 2023-12-31 ENCOUNTER — Other Ambulatory Visit: Payer: Self-pay

## 2023-12-31 ENCOUNTER — Encounter (HOSPITAL_COMMUNITY)
Admission: RE | Disposition: A | Discharge status: Home / Self Care | Payer: Self-pay | Source: Home / Self Care | Attending: Gastroenterology

## 2023-12-31 ENCOUNTER — Ambulatory Visit (HOSPITAL_COMMUNITY)
Admission: RE | Admit: 2023-12-31 | Discharge: 2023-12-31 | Disposition: A | Discharge status: Home / Self Care | Attending: Gastroenterology | Admitting: Gastroenterology

## 2023-12-31 ENCOUNTER — Ambulatory Visit (HOSPITAL_COMMUNITY)

## 2023-12-31 DIAGNOSIS — K640 First degree hemorrhoids: Secondary | ICD-10-CM

## 2023-12-31 DIAGNOSIS — K648 Other hemorrhoids: Secondary | ICD-10-CM

## 2023-12-31 HISTORY — PX: COLONOSCOPY: SHX5424

## 2023-12-31 LAB — POCT PREGNANCY, URINE: Preg Test, Ur: NEGATIVE

## 2023-12-31 SURGERY — COLONOSCOPY
Anesthesia: General

## 2023-12-31 MED ORDER — PROPOFOL 10 MG/ML IV BOLUS
INTRAVENOUS | Status: DC | PRN
  Administered 2023-12-31: 120 mg via INTRAVENOUS

## 2023-12-31 MED ORDER — LACTATED RINGERS IV SOLN: INTRAVENOUS | Status: DC | PRN

## 2023-12-31 MED ORDER — LIDOCAINE 2% (20 MG/ML) 5 ML SYRINGE
INTRAMUSCULAR | Status: DC | PRN
  Administered 2023-12-31: 100 mg via INTRAVENOUS

## 2023-12-31 MED ORDER — PROPOFOL 500 MG/50ML IV EMUL
INTRAVENOUS | Status: DC | PRN
  Administered 2023-12-31: 150 ug/kg/min via INTRAVENOUS

## 2023-12-31 NOTE — Discharge Instructions (Signed)

## 2023-12-31 NOTE — H&P (Signed)
 Primary Care Physician:  Suanne Pfeiffer, NP Primary Gastroenterologist:  Dr. Cinderella  Pre-Procedure History & Physical: HPI:  Nancy Russell is a 33 y.o. 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  Patient has 1 bowel movement daily without any straining on Bristol stool scale type IV-V .Patient is not on phentermine or GLP1 -agonist  The patient denies having any nausea, vomiting, fever, chills,  melena, hematemesis, abdominal distention, abdominal pain, diarrhea, jaundice, pruritus or weight loss.   Last ZHI:ilxz 2017, can't open the report - preoperative   Portion of stomach with no significant histopathologic abnormality. No Helicobacter pylori organisms are seen on routine H&E stain. Negative for malignancy.   Last Colonoscopy:none   FHx: neg for any gastrointestinal/liver disease, no malignancies Social: neg smoking, alcohol or illicit drug use Surgical: Sleeve gastrectomy  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, 2017   Nausea with vomiting    Nexplanon in place 02/12/2015   Nexplanon removal 02/26/2015   Vaginal Pap smear, abnormal     Past Surgical History:  Procedure Laterality Date   ABDOMINAL SURGERY     gastrectomy   CESAREAN SECTION  01/05/2012   Procedure: CESAREAN SECTION;  Surgeon: Vonn VEAR Inch, MD;  Location: [facility];  Service: Obstetrics;  Laterality: N/A;   CYST EXCISION N/A 01/17/2016   Procedure: EXCISION 3CM CYST ON CHEST WALL;  Surgeon: Oneil Budge, MD;  Location: [facility];  Service: General;  Laterality: N/A;   CYSTECTOMY  at age 31   cyst removed from my chest    ESOPHAGOGASTRODUODENOSCOPY  03/15/2015    STOMACH SURGERY      Prior to Admission medications   Medication Sig Start Date End Date Taking? Authorizing Provider  Cholecalciferol 1.25 MG (50000 UT) capsule Take 1 capsule by mouth once a week. 03/01/22  Yes [provider]  cyanocobalamin (VITAMIN B12) 1000 MCG/ML injection Every 2 weeks 03/01/22  Yes [provider]  norethindrone-ethinyl estradiol-FE (JUNEL FE 1/20) 1-20 MG-MCG tablet Take 1 tablet by mouth daily. 06/20/23  Yes Loreli Suzen BIRCH, CNM  Sodium Sulfate-Mag Sulfate-KCl (SUTAB ) [phone] MG TABS 1 KIT AS DIRECTED 11/27/23   Cinderella Deatrice FALCON, MD    Allergies as of 11/27/2023   (No Known Allergies)    Family History  Problem Relation Age of Onset   Anesthesia problems Neg Hx    Hypotension Neg Hx    Malignant hyperthermia Neg Hx    Pseudochol deficiency Neg Hx    Other Neg Hx     Social History   Socioeconomic History   Marital status: Single    Spouse name: Not on file   Number of children: Not on file   Years of education: Not on file   Highest education level: Not on file  Occupational History   Not on file  Tobacco Use   Smoking status: Never   Smokeless tobacco: Never  Vaping Use   Vaping status: Every Day   Substances: Nicotine  Substance and Sexual Activity   Alcohol use: No   Drug use: No   Sexual activity: Yes    Birth control/protection: I.U.D.  Other Topics Concern  Not on file  Social History Narrative   Not on file   Social Drivers of Health   Financial Resource Strain: Low Risk  (05/02/2023)   Received [facility]   Overall Financial Resource Strain (CARDIA)    Difficulty of Paying Living Expenses: Not hard at all  Food Insecurity: No Food Insecurity (05/02/2023)   Received from [facility]   Hunger Vital Sign    Within the past 12 months, you worried that your food would run out before you got the money to buy more.: Never true    Within the past 12 months, the food you bought just didn't last and you  didn't have money to get more.: Never true  Transportation Needs: No Transportation Needs (05/02/2023)   Received from [facility] - Transportation    Lack of Transportation (Non-Medical): No    Lack of Transportation (Medical): No  Physical Activity: Not on file  Stress: Not on file  Social Connections: Unknown (01/06/2022)   Received from [facility]   Social Network    Social Network: Not on file  Intimate Partner Violence: Unknown (01/06/2022)   Received from [facility]   HITS    Physically Hurt: Not on file    Insult or Talk Down To: Not on file    Threaten Physical Harm: Not on file    Scream or Curse: Not on file    Review of Systems: See HPI, otherwise negative ROS  Physical Exam: Vital signs in last 24 hours: Temp:  [97.8 F (36.6 C)] 97.8 F (36.6 C) (12/01 1302) Pulse Rate:  [50] 50 (12/01 1302) Resp:  [9] 9 (12/01 1302) BP: (105)/(68) 105/68 (12/01 1302) SpO2:  [100 %] 100 % (12/01 1302) Weight:  [79.4 kg] 79.4 kg (12/01 1302)   General:   Alert,  Well-developed, well-nourished, pleasant and cooperative in NAD Head:  Normocephalic and atraumatic. Eyes:  Sclera clear, no icterus.   Conjunctiva pink. Ears:  Normal auditory acuity. Nose:  No deformity, discharge,  or lesions. Msk:  Symmetrical without gross deformities. Normal posture. Extremities:  Without clubbing or edema. Neurologic:  Alert and  oriented x4;  grossly normal neurologically. Skin:  Intact without significant lesions or rashes. Psych:  Alert and cooperative. Normal mood and affect.  Impression/Plan:  Nancy Russell is a 34 y.o. 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 Questions have been answered. All parties  agreeable.

## 2023-12-31 NOTE — Transfer of Care (Signed)
 Immediate Anesthesia Transfer of Care Note  Patient: Nancy Russell  Procedure(s) Performed: COLONOSCOPY  Patient Location: Short Stay  Anesthesia Type:MAC  Level of Consciousness: drowsy  Airway & Oxygen  Therapy: Patient Spontanous Breathing  Post-op Assessment: Report given to RN and Post -op Vital signs reviewed and stable  Post vital signs: Reviewed and stable  Last Vitals:  Vitals Value Taken Time  BP 106/60 12/31/2023 14:04  Temp    Pulse 53 12/31/2023 14:04  Resp 15 12/31/2023 14:04  SpO2 100% on RA 12/31/2023 14:04    Last Pain:  Vitals:   12/31/23 1341  TempSrc:   PainSc: 0-No pain      Patients Stated Pain Goal: 8 (12/31/23 1302)  Complications: No notable events documented.

## 2023-12-31 NOTE — Op Note (Signed)
 [facility] Patient Name: Nancy Russell Procedure Date: 12/31/2023 1:35 PM MRN: [id number] Date of Birth: 1990-08-04 Attending MD: Deatrice Dine , MD, [id number] CSN: [id number] Age: 33 Admit Type: Outpatient Procedure:                Colonoscopy Indications:              Hematochezia Providers:                Deatrice Dine, MD, Harlene Lips, Madelin Hunter, RN Referring MD:              Medicines:                Monitored Anesthesia Care Complications:            No immediate complications. Estimated Blood Loss:     Estimated blood loss: none. Procedure:                Pre-Anesthesia Assessment:                           - Prior to the procedure, a History and Physical                            was performed, and patient medications and                            allergies were reviewed. The patient's tolerance of                            previous anesthesia was also reviewed. The risks                            and benefits of the procedure and the sedation                            options and risks were discussed with the patient.                            All questions were answered, and informed consent                            was obtained. Prior Anticoagulants: The patient has                            taken no anticoagulant or antiplatelet agents. ASA                            Grade Assessment: II - A patient with mild systemic                            disease. After reviewing the risks and benefits,                            the patient  was deemed in satisfactory condition to                            undergo the procedure.                           After obtaining informed consent, the colonoscope                            was passed under direct vision. Throughout the                            procedure, the patient's blood pressure, pulse, and                            oxygen  saturations were monitored continuously.  The                            [id number] ([id number]) Colon was introduced through                            the anus and advanced to the the terminal ileum.                            The colonoscopy was performed without difficulty.                            The patient tolerated the procedure well. The                            quality of the bowel preparation was evaluated                            using the BBPS [facility] Bowel Preparation Scale)                            with scores of: Right Colon = 3, Transverse Colon =                            3 and Left Colon = 3 (entire mucosa seen well with                            no residual staining, small fragments of stool or                            opaque liquid). The total BBPS score equals 9. The                            terminal ileum, ileocecal valve, appendiceal                            orifice, and rectum were photographed. Scope In: 1:47:57 PM Scope Out: 1:58:18 PM Scope Withdrawal Time: 0 hours 8 minutes 37  seconds  Total Procedure Duration: 0 hours 10 minutes 21 seconds  Findings:      The perianal and digital rectal examinations were normal.      The terminal ileum appeared normal.      There is no endoscopic evidence of bleeding or inflammation in the       entire colon.      Non-bleeding internal hemorrhoids were found during retroflexion. The       hemorrhoids were small. Impression:               - The examined portion of the ileum was normal.                           - Non-bleeding internal hemorrhoids.                           - No specimens collected. Moderate Sedation:      Per Anesthesia Care Recommendation:           - Patient has a contact number available for                            emergencies. The signs and symptoms of potential                            delayed complications were discussed with the                            patient. Return to normal activities tomorrow.                             Written discharge instructions were provided to the                            patient.                           - Resume previous diet.                           - Continue present medications.                           - Repeat colonoscopy at age 89 for screening                            purposes. Procedure Code(s):        --- Professional ---                           [phone], Colonoscopy, flexible; diagnostic, including                            collection of specimen(s) by brushing or washing,                            when performed (separate procedure) Diagnosis Code(s):        --- Professional ---  K64.8, Other hemorrhoids                           K92.1, Melena (includes Hematochezia) CPT copyright 2022 American Medical Association. All rights reserved. The codes documented in this report are preliminary and upon coder review may  be revised to meet current compliance requirements. Deatrice Dine, MD Deatrice Dine, MD 12/31/2023 2:05:49 PM This report has been signed electronically. Number of Addenda: 0

## 2023-12-31 NOTE — Anesthesia Preprocedure Evaluation (Signed)
 Anesthesia Evaluation  Patient identified by MRN, date of birth, ID band Patient awake    Reviewed: Allergy & Precautions, H&P , NPO status , Patient's Chart, lab work & pertinent test results  Airway Mallampati: II  TM Distance: >3 FB Neck ROM: Full    Dental no notable dental hx.    Pulmonary neg pulmonary ROS   Pulmonary exam normal breath sounds clear to auscultation       Cardiovascular negative cardio ROS Normal cardiovascular exam Rhythm:Regular Rate:Normal     Neuro/Psych  Headaches  negative psych ROS   GI/Hepatic negative GI ROS, Neg liver ROS,,,  Endo/Other  negative endocrine ROS    Renal/GU negative Renal ROS  negative genitourinary   Musculoskeletal negative musculoskeletal ROS (+)    Abdominal   Peds negative pediatric ROS (+)  Hematology negative hematology ROS (+)   Anesthesia Other Findings   Reproductive/Obstetrics negative OB ROS                              Anesthesia Physical Anesthesia Plan  ASA: 1  Anesthesia Plan: General   Post-op Pain Management:    Induction: Intravenous  PONV Risk Score and Plan:   Airway Management Planned: Nasal Cannula  Additional Equipment:   Intra-op Plan:   Post-operative Plan:   Informed Consent: I have reviewed the patients History and Physical, chart, labs and discussed the procedure including the risks, benefits and alternatives for the proposed anesthesia with the patient or authorized representative who has indicated his/her understanding and acceptance.     Dental advisory given  Plan Discussed with: CRNA  Anesthesia Plan Comments:         Anesthesia Quick Evaluation

## 2023-12-31 NOTE — Anesthesia Postprocedure Evaluation (Signed)
 Anesthesia Post Note  Patient: Nancy Russell  Procedure(s) Performed: COLONOSCOPY  Patient location during evaluation: PACU Anesthesia Type: General Level of consciousness: awake and alert Pain management: pain level controlled Vital Signs Assessment: post-procedure vital signs reviewed and stable Respiratory status: spontaneous breathing, nonlabored ventilation, respiratory function stable and patient connected to nasal cannula oxygen  Cardiovascular status: blood pressure returned to baseline and stable Postop Assessment: no apparent nausea or vomiting Anesthetic complications: no   No notable events documented.   Last Vitals:  Vitals:   12/31/23 1302 12/31/23 1403  BP: 105/68 106/60  Pulse: (!) 50 (!) 50  Resp: (!) 9 16  Temp: 36.6 C 36.5 C  SpO2: 100% 100%    Last Pain:  Vitals:   12/31/23 1403  TempSrc: Oral  PainSc: 0-No pain                 Andrea Limes

## 2024-01-02 ENCOUNTER — Encounter (HOSPITAL_COMMUNITY): Payer: Self-pay | Admitting: Gastroenterology
